# Patient Record
Sex: Female | Born: 1940 | Hispanic: No | Marital: Single
Health system: Midwestern US, Community
[De-identification: ages and names within clinical notes are randomized; demographics above are authoritative.]

---

## 2012-04-13 NOTE — Op Note (Unsigned)
[facility]  OPERATIVE REPORT                                            Name: Mena, Simonis                                            MR#: [id number]                                            Account #: [id number]                                            Date of Adm: 04/13/2012        Facility [facility]  DocId [id number]  ChartScript-[id number]    DATE OF SURGERY: 04/13/2012    SURGEON: Josetta Huddle, DPM    PREOPERATIVE DIAGNOSES  1. Soft tissue mass of the left foot sub-first metatarsal head.  2. Diabetes with peripheral neuropathy.    POSTOPERATIVE DIAGNOSES  1. Soft tissue mass of the left foot first metatarsal head.  2. Diabetes with peripheral neuropathy.    PROCEDURE PERFORMED: Excision of soft tissue mass  with O-to-Z flap closure, left plantar foot.    ANESTHESIOLOGIST:    DESCRIPTION OF PROCEDURE: The patient prepped and  draped in the usual sterile manner. Attention directed to the plantar  aspect of the left foot where a palpable nodular mass was noted  approximately 1.5 x 1.2 cm. This appeared to be raised at the skin  level at the junction of the first metatarsal head and great toe.  There was some interruption in skin lines, but no definitive lesion.  There seemed to be a hard nodular cyst in this area. Using a #15  scalpel, the mass itself was encircled in its entirety, leaving a  border of 3 to 4 mm of normal tissue. The dissection through the  epidermis, dermis, to the subcutaneous level and the skin section  itself was removed and undermined to deeper tissue at the  subcutaneous level at the subcutaneous level. A cystic structure  was noted, which was well encapsulated with small areas of blue  discoloration with small vascular structures attaching to a  contiguous ________through the subcutaneous level and deeper  fascia. This did penetrate down an additional 1 cm to the capsule  and under sesamoid area of the first metatarsal. This was dissected  with a  #15 scalpel and forceps and removed in its entirety,  submitted for pathology. Small little vessels that were bleeding  were tied with 3-0 Vicryl and the area flushed with saline solution.  In order to close the defect, an O-to-Z flap was created. The arms  of the flap were made both distally and proximally with curvilinear  incisions full-thickness to the fascial level. The skin was then  rotated centrally with full-thickness tissue to cover the large defect.  This encompassed an area of 2.5 x 2.5 cm. The "Z" itself  was closed after the flaps were rotated with 2 and 3-0 nylon simple  interrupted  sutures, thus completing a complete closure of the  defect. Bleeding was minimal at the procedure's end, and a  dressing consisting of Betadine-soaked Adaptic, sterile gauze,  fluffs, Kerlix bandage, cast padding, Ace bandage, and stockinette  was applied. The patient tolerated the procedure and the anesthesia  well. The patient placed in a surgical shoe. Instructions given for  nonweightbearing other than walking to the bathroom on the heel.  The patient prescribed Ultram 50 mg every 8 to 12 hours #20  dispensed. A followup visit in the office in 4 days. The patient  given the emergency numbers should a problem arise. The patient  discharged in good condition.        __________________________________  Enis Slipper / JS  D:  04/13/2012   13:59  T:  04/13/2012   15:32  Job #:  [id number]              wm    CS Doc#:  [id number]                                  Page 1 of 2

## 2018-08-17 ENCOUNTER — Ambulatory Visit: Attending: Neurology

## 2018-08-17 ENCOUNTER — Ambulatory Visit: Admit: 2018-08-17 | Discharge: 2018-08-17 | Payer: PRIVATE HEALTH INSURANCE | Attending: Neurology

## 2018-08-17 DIAGNOSIS — G629 Polyneuropathy, unspecified: Secondary | ICD-10-CM

## 2018-08-17 NOTE — Progress Notes (Signed)
Progress  Notes by Maxine Glenn, MD at 08/17/18 1115                Author: Maxine Glenn, MD  Service: --  Author Type: Physician       Filed: 08/17/18 1210  Encounter Date: 08/17/2018  Status: Signed          Editor: Maxine Glenn, MD (Physician)                   Maxine Glenn, MD    [address], [place] [address]  [phone]   [email or web address]      NEUROLOGY CONSULT NOTE           Name  Heather Brooks  Age  78 y.o.          MRN  [id number]  DOB  11-24-1940          DATE   08/17/2018               Referring Physician: No ref. provider found     Primary Physician: None      No chief complaint on file.            History of Present Illness:         The patient is a 77 y.o.  female  who has DM, HTN, Hypothyroidism, HLD, who is here for evaluation of:      Unsteadiness: ongoing for past year, more so on uneven grounds, has had occasional paresthesias in feet- on GBP 100mg  TID for a year now   No weakness.    Had a fall 2 years ago- with water on floor   Back pain- with sitting and long walks      Dizziness: Has had 2-3 episodes over the past 6 months, feels lightheaded when going from sitting to standing.      Had lab w/u recently 7/16 : Tsh, serum IFE, b12, homocysteine, hb   Kappa light chain noted- being followed by Dr. Hardin Negus   Also had a1c done- will get results faxed              Allergies not on file             No past medical history on file.       No past surgical history on file.         Social History          Tobacco Use         ?  Smoking status:  Not on file       Substance Use Topics         ?  Alcohol use:  Not on file            No family history on file.         Review of Systems:        Review of Systems    Musculoskeletal: Positive for back pain, joint pain  and myalgias. Falls: imbalance .    Neurological: Positive for dizziness and weakness .           Exam:           Visit Vitals      BP  112/68 (BP 1 Location: Left arm, BP Patient Position: Sitting)     Pulse  74     Temp  98  ??F (36.7 ??C) (Temporal)     Ht  5\' 1"  (1.549 m)  Wt  120 lb (54.4 kg)     SpO2  98%        BMI  22.67 kg/m??           General Exam:   General:     Well developed, well nourished. Patient in no apparent distress        Head:  Normocephalic, atraumatic, anicteric sclera              Ext:  No pedal edema        Skin:  No overt signs of rash or insect bites              Neurological Exam:      Mental Status:  Alert and oriented to person place and time        Speech:  Fluent no aphasia or dysarthria.     Cranial Nerves:     Facial sensation is normal. Facial movement is symmetric.  Palate is midline.  Normal sternocleidomastoid strength. Tongue is midline. Hearing is intact bilaterally.        Eyes:  EOM's full, no nystagmus, no ptosis.     Motor:   Full and symmetric strength of upper and lower proximal and distal muscles. Normal bulk and tone.      Reflexes:    Deep tendon reflexes 2+/4 and symmetrical, 1+ in ankles     Sensory:    Symmetrically intact  To proprioception, Rombergs negative     Gait:   Gait is balanced and fluid with normal arm swing.   Able to tandem walk.     Tremor:    No tremor noted.        Cerebellar:   Normal finger to nose bilaterally.                    Test Data:        Lab Data      No results found for this or any previous visit (from the past 1008 hour(s)).       Imaging Data      No results found.         Assessment/Plan:        Myranda has DM and has had unsteadiness for a year. Her clinical exam shows no obvious abnormality- although I do see that she is very cautious while walking. I would like to review the MRI Brain images (done at [facility]) and we will discuss that  next visit.   I would also suggest getting a NCS/EMG to evaluate her nerve function. She has had majority of the lab w/u that I typically do for neuropathy eval so we will not repeat them. I will reach out to Dr. Wilmon Paligheim's office top get the more recent labs.   I may have to check b6, mma and lymes pending review  of labs.            There is no problem list on file for this patient.           Orders Placed This Encounter        ?  METHYLMALONIC ACID     ?  LYME AB, IGG & IGM BY WB        ?  VITAMIN B6              There are no discontinued medications.     Greater than 50% of this 60 minute visit was spent counseling the patient regarding diagnosis, prognosis  and treatment and/or coordinating with outside facilities.                  This report was created using a voice recognition software program. Minor grammatical and syntax errors are common. Please call me if you have any concerns  or if any clarification is needed.       Maxine Glenn, MD      Note created electronically using EPIC, please excuse any typographic errors

## 2018-08-17 NOTE — Progress Notes (Signed)
Please notify patient that lab work up looks good

## 2018-08-17 NOTE — Progress Notes (Signed)
Era BumpersSweta Lashala Laser, MD   [address], [address]  [place], [place] [id number]  [phone]  [email or web address]    NEUROLOGY CONSULT NOTE    Name Heather Brooks Age 78 y.o.   MRN [id number] DOB 05/16/1940   DATE  08/17/2018         Referring Physician: No ref. provider found    Primary Physician: None    No chief complaint on file.       History of Present Illness:      The patient is a 78 y.o.  female who has DM, HTN, Hypothyroidism, HLD, who is here for evaluation of:    Unsteadiness: ongoing for past year, more so on uneven grounds, has had occasional paresthesias in feet- on GBP 100mg  TID for a year now  No weakness.   Had a fall 2 years ago- with water on floor  Back pain- with sitting and long walks    Dizziness: Has had 2-3 episodes over the past 6 months, feels lightheaded when going from sitting to standing.    Had lab w/u recently 7/16 : Tsh, serum IFE, b12, homocysteine, hb  Kappa light chain noted- being followed by Dr. Linna DarnerBenkel  Also had a1c done- will get results faxed          Allergies not on file         No past medical history on file.     No past surgical history on file.     Social History     Tobacco Use   ??? Smoking status: Not on file   Substance Use Topics   ??? Alcohol use: Not on file        No family history on file.     Review of Systems:     Review of Systems   Musculoskeletal: Positive for back pain, joint pain and myalgias. Falls: imbalance.   Neurological: Positive for dizziness and weakness.       Exam:       Visit Vitals  BP 112/68 (BP 1 Location: Left arm, BP Patient Position: Sitting)   Pulse 74   Temp 98 ??F (36.7 ??C) (Temporal)   Ht 5\' 1"  (1.549 m)   Wt 120 lb (54.4 kg)   SpO2 98%   BMI 22.67 kg/m??     General Exam:  General:   Well developed, well nourished. Patient in no apparent distress   Head: Normocephalic, atraumatic, anicteric sclera       Ext: No pedal edema   Skin: No overt signs of rash or insect bites         Neurological Exam:   Mental Status: Alert and oriented to person place and time   Speech: Fluent no aphasia or dysarthria.   Cranial Nerves:    Facial sensation is normal. Facial movement is symmetric.  Palate is midline.  Normal sternocleidomastoid strength. Tongue is midline. Hearing is intact bilaterally.   Eyes: EOM's full, no nystagmus, no ptosis.   Motor:  Full and symmetric strength of upper and lower proximal and distal muscles. Normal bulk and tone.    Reflexes:   Deep tendon reflexes 2+/4 and symmetrical, 1+ in ankles   Sensory:   Symmetrically intact  To proprioception, Rombergs negative   Gait:  Gait is balanced and fluid with normal arm swing.   Able to tandem walk.   Tremor:   No tremor noted.   Cerebellar:  Normal finger to nose bilaterally.         Test Data:  Lab Data    No results found for this or any previous visit (from the past 1008 hour(s)).     Imaging Data    No results found.     Assessment/Plan:     Heather Brooks has DM and has had unsteadiness for a year. Her clinical exam shows no obvious abnormality- although I do see that she is very cautious while walking. I would like to review the MRI Brain images (done at [facility]) and we will discuss that next visit.  I would also suggest getting a NCS/EMG to evaluate her nerve function. She has had majority of the lab w/u that I typically do for neuropathy eval so we will not repeat them. I will reach out to Dr. Marene Lenz office top get the more recent labs.  I may have to check b6, mma and lymes pending review of labs.        There is no problem list on file for this patient.      Orders Placed This Encounter   ??? METHYLMALONIC ACID   ??? LYME AB, IGG & IGM BY WB   ??? VITAMIN B6         There are no discontinued medications.    Greater than 50% of this 60 minute visit was spent counseling the patient regarding diagnosis, prognosis and treatment and/or coordinating with outside facilities.              This report was created using a voice recognition software program. Minor grammatical and syntax errors are common. Please call me if you have any concerns or if any clarification is needed.     Maxine Glenn, MD    Note created electronically using EPIC, please excuse any typographic errors

## 2018-09-01 LAB — LYME AB, IGG & IGM BY WB
IGG P18 AB: ABSENT
IGG P23 AB: ABSENT
IGG P28 AB: ABSENT
IGG P30 AB: ABSENT
IGG P45 AB: ABSENT
IGG P58 AB: ABSENT
IGG P66 AB: ABSENT
IGM P39 AB: ABSENT
IGM P41 AB: ABSENT
IgG P18 Ab: ABSENT
IgG P23 Ab: ABSENT
IgG P28 Ab: ABSENT
IgG P30 Ab: ABSENT
IgG P45 Ab: ABSENT
IgG P58 Ab: ABSENT
IgG P66 Ab: ABSENT
IgG P93 Ab: ABSENT
IgG P93 Ab: ABSENT
IgM P39 Ab: ABSENT
IgM P41 Ab: ABSENT
LYME AB, IGG WB INTERP.: NEGATIVE
LYME AB, IGM WB INTERP.: NEGATIVE
Lyme Ab, IgG WB Interp.: NEGATIVE
Lyme Ab, IgM WB Interp.: NEGATIVE

## 2018-09-01 LAB — VITAMIN B6
VITAMIN B6, 004656: 33.2 ug/L — ABNORMAL HIGH (ref 2.0–32.8)
Vitamin B6: 33.2 ug/L — ABNORMAL HIGH (ref 2.0–32.8)

## 2018-09-01 LAB — METHYLMALONIC ACID: METHYLMALONIC ACID, SERUM: 193 nmol/L (ref 0–378)

## 2018-09-01 LAB — ONCOTYPE DX DCIS: METHYLMALONIC ACID, SERUM: 193 nmol/L (ref 0–378)

## 2018-09-05 ENCOUNTER — Ambulatory Visit: Attending: Neurology

## 2018-09-05 ENCOUNTER — Ambulatory Visit: Admit: 2018-09-05 | Discharge: 2018-09-05 | Payer: PRIVATE HEALTH INSURANCE | Attending: Neurology

## 2018-09-05 DIAGNOSIS — G629 Polyneuropathy, unspecified: Secondary | ICD-10-CM

## 2018-09-05 MED ORDER — CYANOCOBALAMIN 500 MCG TAB
500 mcg | ORAL_TABLET | Freq: Every day | ORAL | 2 refills | Status: AC
Start: 2018-09-05 — End: ?

## 2018-09-05 NOTE — Progress Notes (Signed)
Progress  Notes by Heather Glenn, MD at 09/05/18 1230                Author: Maxine Glenn, MD  Service: --  Author Type: Physician       Filed: 09/05/18 1412  Encounter Date: 09/05/2018  Status: Signed          Editor: Heather Glenn, MD (Physician)                   Heather Glenn, MD    [address], [place] [address]  [phone]   [email or web address]      NEUROLOGY CONSULT NOTE           Name  Heather Brooks  Age  78 y.o.          MRN  [id number]  DOB  Sep 29, 1940          DATE   09/05/2018               Referring Physician: Maxine Glenn, MD     Primary Physician: None      No chief complaint on file.       09/05/2018: Since last visit had lab work-up for Lyme, B6 and methylmalonic acid that were all essentially normal.   TSH normal, A1c 5.9-scanned in chart   MRI brain report reviewed- no report of cerebellar atrophy, appears age appropriate.        History of Present Illness:         The patient is a 78 y.o.  female  who has DM, HTN, Hypothyroidism, HLD, who is here for evaluation of:      Unsteadiness: ongoing for past year, more so on uneven grounds, has had occasional paresthesias in feet- on GBP 100mg  TID for a year now   No weakness.    Had a fall 2 years ago- with water on floor   Back pain- with sitting and long walks      Dizziness: Has had 2-3 episodes over the past 6 months, feels lightheaded when going from sitting to standing.      Had lab w/u recently 7/16 : Tsh, serum IFE, b12, homocysteine, hb   Kappa light chain noted- being followed by Dr. Hardin Brooks   Also had a1c done- will get results faxed              Allergies not on file               Past Medical History:        Diagnosis  Date         ?  Diabetes mellitus ([place])       ?  Hypercholesteremia       ?  Osteoarthritis           ?  Thyroid disorder                Past Surgical History:         Procedure  Laterality  Date          ?  HX CATARACT REMOVAL         ?  HX KNEE ARTHROSCOPY         ?  HX MASTECTOMY         ?  HX ROTATOR CUFF  REPAIR  N/A            Shoulder  Social History          Tobacco Use         ?  Smoking status:  Former Smoker              Packs/day:  2.00         Years:  25.00         Pack years:  50.00         ?  Smokeless tobacco:  Never Used       Substance Use Topics         ?  Alcohol use:  Yes            No family history on file.         Review of Systems:        Review of Systems    Musculoskeletal: Positive for back pain, joint pain  and myalgias. Falls: imbalance .    Neurological: Positive for dizziness and weakness .           Exam:           Visit Vitals      Temp  97.9 ??F (36.6 ??C) (Temporal)     Ht  5\' 1"  (1.549 m)     Wt  118 lb (53.5 kg)        BMI  22.30 kg/m??           General Exam:   General:     Well developed, well nourished. Patient in no apparent distress        Head:  Normocephalic, atraumatic, anicteric sclera              Ext:  No pedal edema        Skin:  No overt signs of rash or insect bites              Neurological Exam:      Mental Status:  Alert and oriented to person place and time        Speech:  Fluent no aphasia or dysarthria.     Cranial Nerves:     Facial sensation is normal. Facial movement is symmetric.  Palate is midline.  Normal sternocleidomastoid strength. Tongue is midline. Hearing is intact bilaterally.        Eyes:  EOM's full, no nystagmus, no ptosis.     Motor:   Full and symmetric strength of upper and lower proximal and distal muscles. Normal bulk and tone.      Reflexes:    Deep tendon reflexes 2+/4 and symmetrical, 1+ in ankles     Sensory:    Symmetrically intact  To proprioception, Rombergs negative     Gait:   Gait is balanced and fluid with normal arm swing.   Able to tandem walk.     Tremor:    No tremor noted.        Cerebellar:   Normal finger to nose bilaterally.                    Test Data:        Lab Data        Recent Results (from the past 1008 hour(s))     METHYLMALONIC ACID          Collection Time: 08/29/18  1:33 PM         Result  Value  Ref  Range            METHYLMALONIC ACID,  SERUM  193  0 - 378 nmol/L       Disclaimer  Comment         LYME AB, IGG & IGM BY WB          Collection Time: 08/29/18  1:33 PM         Result  Value  Ref Range            IgG P93 Ab  Absent         IgG P66 Ab  Absent         IgG P58 Ab  Absent         IgG P45 Ab  Absent         IgG P41 Ab  Present (A)         IgG P39 Ab  Present (A)         IgG P30 Ab  Absent         IgG P28 Ab  Absent         IgG P23 Ab  Absent         IgG P18 Ab  Absent         Lyme Ab, IgG WB Interp.  Negative         IgM P41 Ab  Absent         IgM P39 Ab  Absent         IgM P23 Ab  Present (A)         Lyme Ab, IgM WB Interp.  Negative         VITAMIN B6          Collection Time: 08/29/18  1:33 PM         Result  Value  Ref Range            Vitamin B6  33.2 (H)  2.0 - 32.8 ug/L            Imaging Data      No results found.         Assessment/Plan:        Heather Brooks has DM and has had unsteadiness for a year. Her clinical exam shows no obvious abnormality- although I do see that she is very cautious while walking.    Reviewed MRI brain report.     Nerve conduction study/EMG today shows no evidence of a diffuse large fiber neuropathy.     Lab work-up has essentially been normal.     Given that she is on metformin, I have advised her to start vitamin B12 supplementation 500 mcg daily as chronic metformin use has been associated with B12 deficiency (blood levels can be normal)   I will see her back in 3 months.                 There is no problem list on file for this patient.         No orders of the defined types were placed in this encounter.            There are no discontinued medications.     Greater than 50% of this 25 minute visit was spent counseling the patient regarding diagnosis, prognosis and treatment and/or coordinating with outside facilities.                  This report was created using a voice recognition software program. Minor grammatical and syntax errors are common. Please call me if you  have any concerns  or if any clarification is needed.       Heather Bumpers, MD      Note created electronically using EPIC, please excuse any typographic errors

## 2018-09-05 NOTE — Progress Notes (Signed)
Heather Glenn, MD   [address], [place] [address]  [phone]  [email or web address]    NEUROLOGY CONSULT NOTE    Name Heather Brooks Age 78 y.o.   MRN [id number] DOB Apr 12, 1940   DATE  09/05/2018         Referring Physician: Maxine Glenn, MD    Primary Physician: None    No chief complaint on file.     09/05/2018: Since last visit had lab work-up for Lyme, B6 and methylmalonic acid that were all essentially normal.  TSH normal, A1c 5.9-scanned in chart  MRI brain report reviewed??? no report of cerebellar atrophy, appears age appropriate.    History of Present Illness:      The patient is a 78 y.o.  female who has DM, HTN, Hypothyroidism, HLD, who is here for evaluation of:    Unsteadiness: ongoing for past year, more so on uneven grounds, has had occasional paresthesias in feet- on GBP 100mg  TID for a year now  No weakness.   Had a fall 2 years ago- with water on floor  Back pain- with sitting and long walks    Dizziness: Has had 2-3 episodes over the past 6 months, feels lightheaded when going from sitting to standing.    Had lab w/u recently 7/16 : Tsh, serum IFE, b12, homocysteine, hb  Kappa light chain noted- being followed by Dr. Hardin Brooks  Also had a1c done- will get results faxed          Allergies not on file         Past Medical History:   Diagnosis Date   ??? Diabetes mellitus ([place])    ??? Hypercholesteremia    ??? Osteoarthritis    ??? Thyroid disorder         Past Surgical History:   Procedure Laterality Date   ??? HX CATARACT REMOVAL     ??? HX KNEE ARTHROSCOPY     ??? HX MASTECTOMY     ??? HX ROTATOR CUFF REPAIR N/A     Shoulder         Social History     Tobacco Use   ??? Smoking status: Former Smoker     Packs/day: 2.00     Years: 25.00     Pack years: 50.00   ??? Smokeless tobacco: Never Used   Substance Use Topics   ??? Alcohol use: Yes        No family history on file.     Review of Systems:     Review of Systems   Musculoskeletal: Positive for back pain, joint pain and myalgias. Falls: imbalance.    Neurological: Positive for dizziness and weakness.       Exam:       Visit Vitals  Temp 97.9 ??F (36.6 ??C) (Temporal)   Ht 5\' 1"  (1.549 m)   Wt 118 lb (53.5 kg)   BMI 22.30 kg/m??     General Exam:  General:   Well developed, well nourished. Patient in no apparent distress   Head: Normocephalic, atraumatic, anicteric sclera       Ext: No pedal edema   Skin: No overt signs of rash or insect bites         Neurological Exam:  Mental Status: Alert and oriented to person place and time   Speech: Fluent no aphasia or dysarthria.   Cranial Nerves:    Facial sensation is normal. Facial movement is symmetric.  Palate is midline.  Normal sternocleidomastoid strength.  Tongue is midline. Hearing is intact bilaterally.   Eyes: EOM's full, no nystagmus, no ptosis.   Motor:  Full and symmetric strength of upper and lower proximal and distal muscles. Normal bulk and tone.    Reflexes:   Deep tendon reflexes 2+/4 and symmetrical, 1+ in ankles   Sensory:   Symmetrically intact  To proprioception, Rombergs negative   Gait:  Gait is balanced and fluid with normal arm swing.   Able to tandem walk.   Tremor:   No tremor noted.   Cerebellar:  Normal finger to nose bilaterally.         Test Data:     Lab Data    Recent Results (from the past 1008 hour(s))   METHYLMALONIC ACID    Collection Time: 08/29/18  1:33 PM   Result Value Ref Range    METHYLMALONIC ACID, SERUM 193 0 - 378 nmol/L    Disclaimer Comment    LYME AB, IGG & IGM BY WB    Collection Time: 08/29/18  1:33 PM   Result Value Ref Range    IgG P93 Ab Absent     IgG P66 Ab Absent     IgG P58 Ab Absent     IgG P45 Ab Absent     IgG P41 Ab Present (A)     IgG P39 Ab Present (A)     IgG P30 Ab Absent     IgG P28 Ab Absent     IgG P23 Ab Absent     IgG P18 Ab Absent     Lyme Ab, IgG WB Interp. Negative     IgM P41 Ab Absent     IgM P39 Ab Absent     IgM P23 Ab Present (A)     Lyme Ab, IgM WB Interp. Negative    VITAMIN B6    Collection Time: 08/29/18  1:33 PM   Result Value Ref Range     Vitamin B6 33.2 (H) 2.0 - 32.8 ug/L        Imaging Data    No results found.     Assessment/Plan:     Heather Brooks has DM and has had unsteadiness for a year. Her clinical exam shows no obvious abnormality- although I do see that she is very cautious while walking.   Reviewed MRI brain report.    Nerve conduction study/EMG today shows no evidence of a diffuse large fiber neuropathy.    Lab work-up has essentially been normal.    Given that she is on metformin, I have advised her to start vitamin B12 supplementation 500 mcg daily as chronic metformin use has been associated with B12 deficiency (blood levels can be normal)  I will see her back in 3 months.            There is no problem list on file for this patient.      No orders of the defined types were placed in this encounter.        There are no discontinued medications.    Greater than 50% of this 25 minute visit was spent counseling the patient regarding diagnosis, prognosis and treatment and/or coordinating with outside facilities.             This report was created using a voice recognition software program. Minor grammatical and syntax errors are common. Please call me if you have any concerns or if any clarification is needed.     Heather BumpersSweta Heather Pinzon, MD    Note  created electronically using EPIC, please excuse any typographic errors

## 2018-11-01 ENCOUNTER — Encounter: Attending: Neurology

## 2020-01-25 IMAGING — MG MAMMOGRAM SCREENING LEFT UNILATERAL 3D TOMO WITH CAD
2 series · 3 of 6 positions shown · non-contrast
Comparison: 11/06/2018.

MAMMOGRAM SCREENING LEFT UNILATERAL 3D TOMO WITH CAD, 01/25/2020 [DATE]: 
CLINICAL INDICATION: History of right breast cancer with mastectomy 8706.
TECHNIQUE: Unilateral left breast digital mammography and 3-D Tomosynthesis were 
obtained. In addition, computer-aided detection was utilized. 
BREAST DENSITY: (Level C) The breasts are heterogeneously dense, which may 
obscure small masses.

[L MLO]
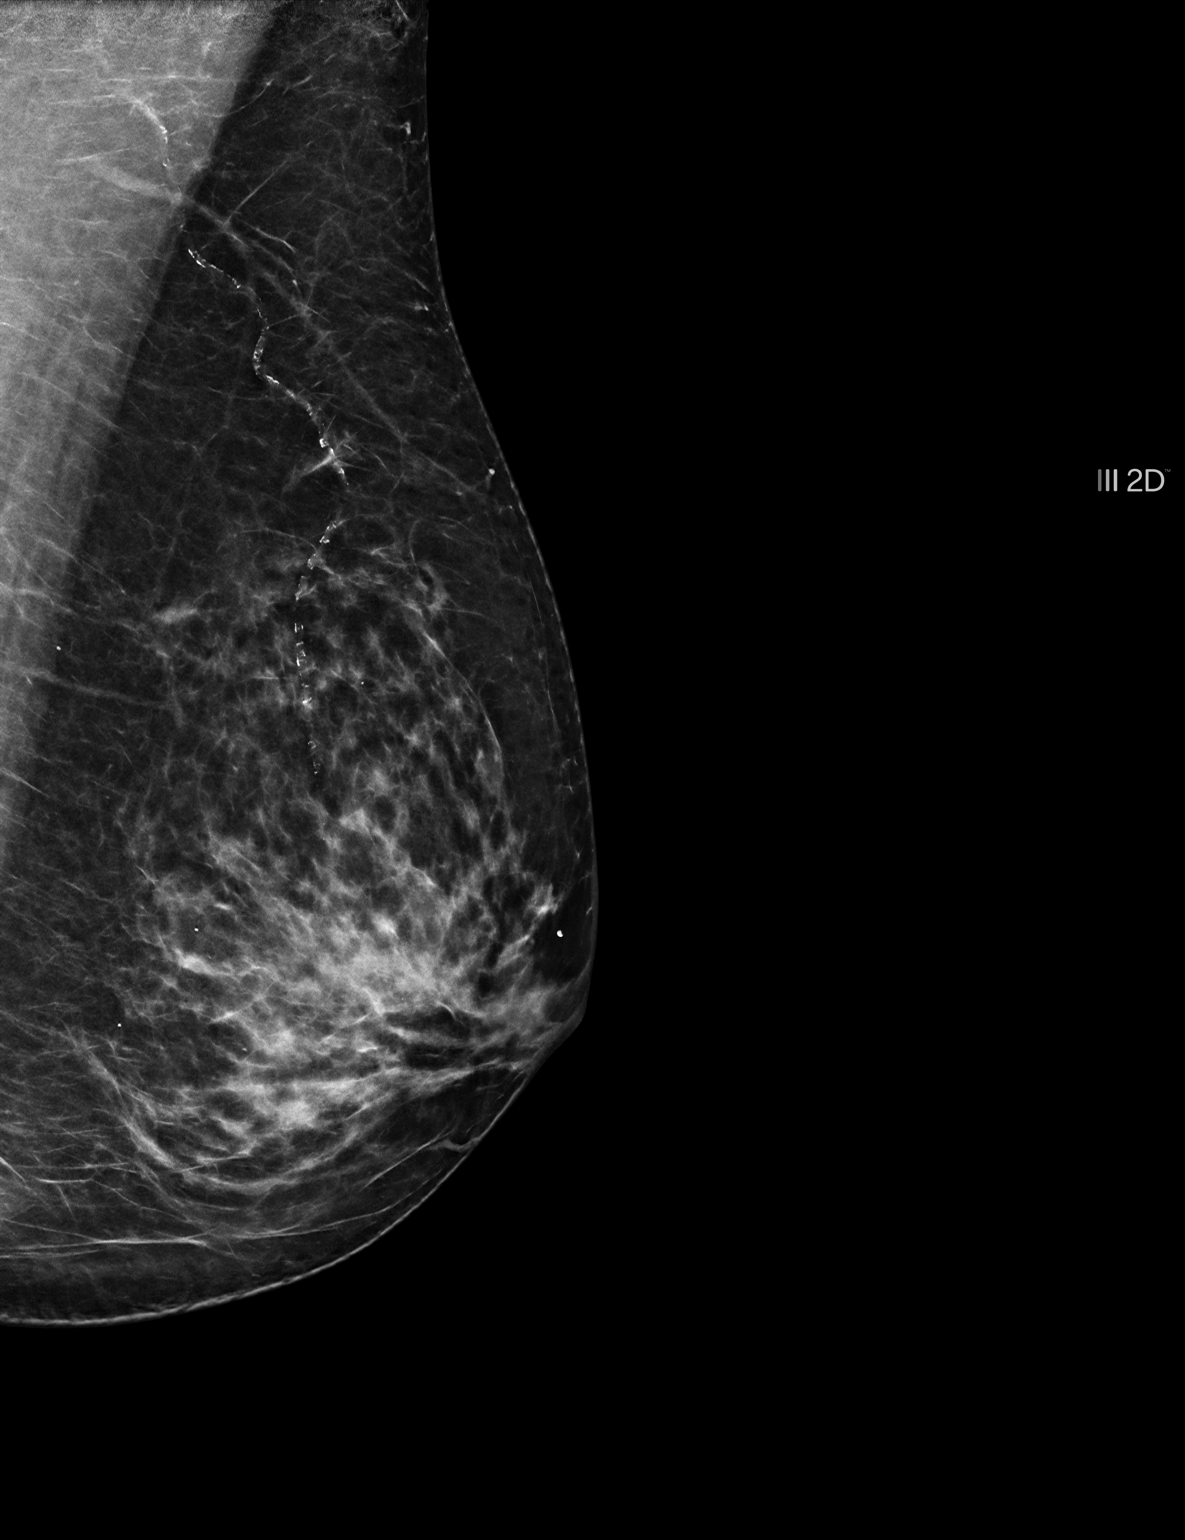

[L MLO tomo · 2 of 62 frames shown]
[frame 21/62]
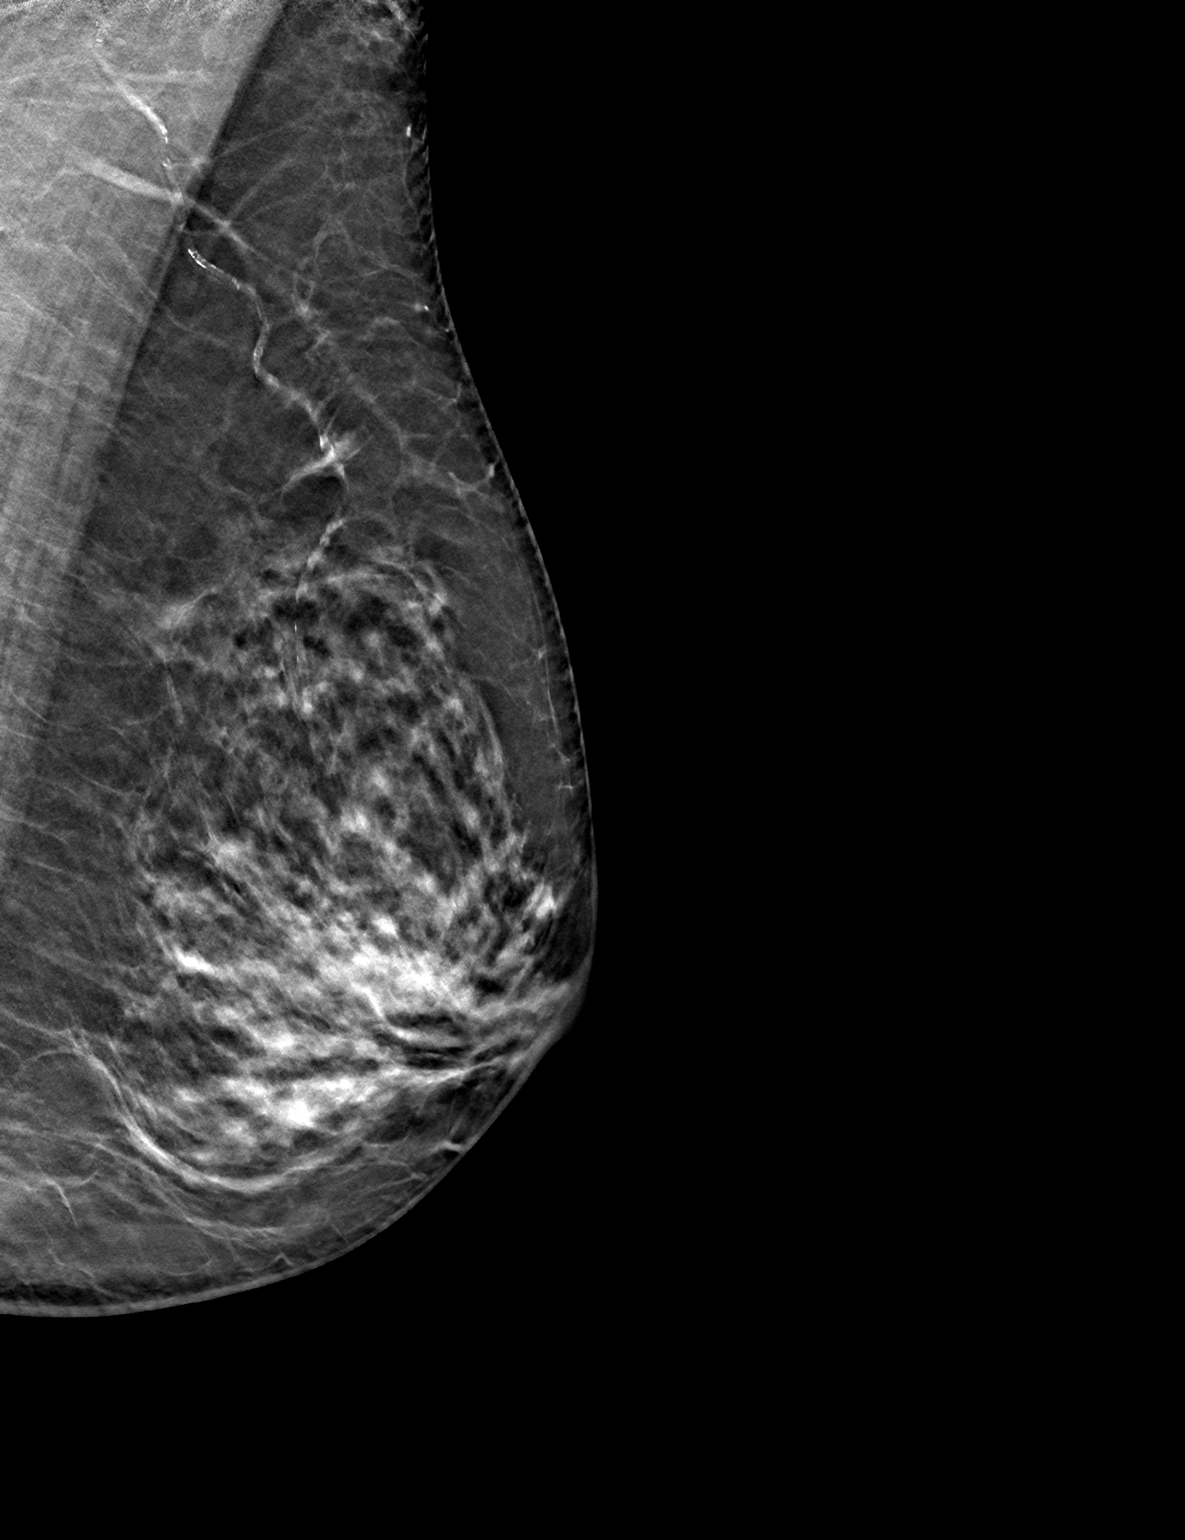
[frame 31/62]
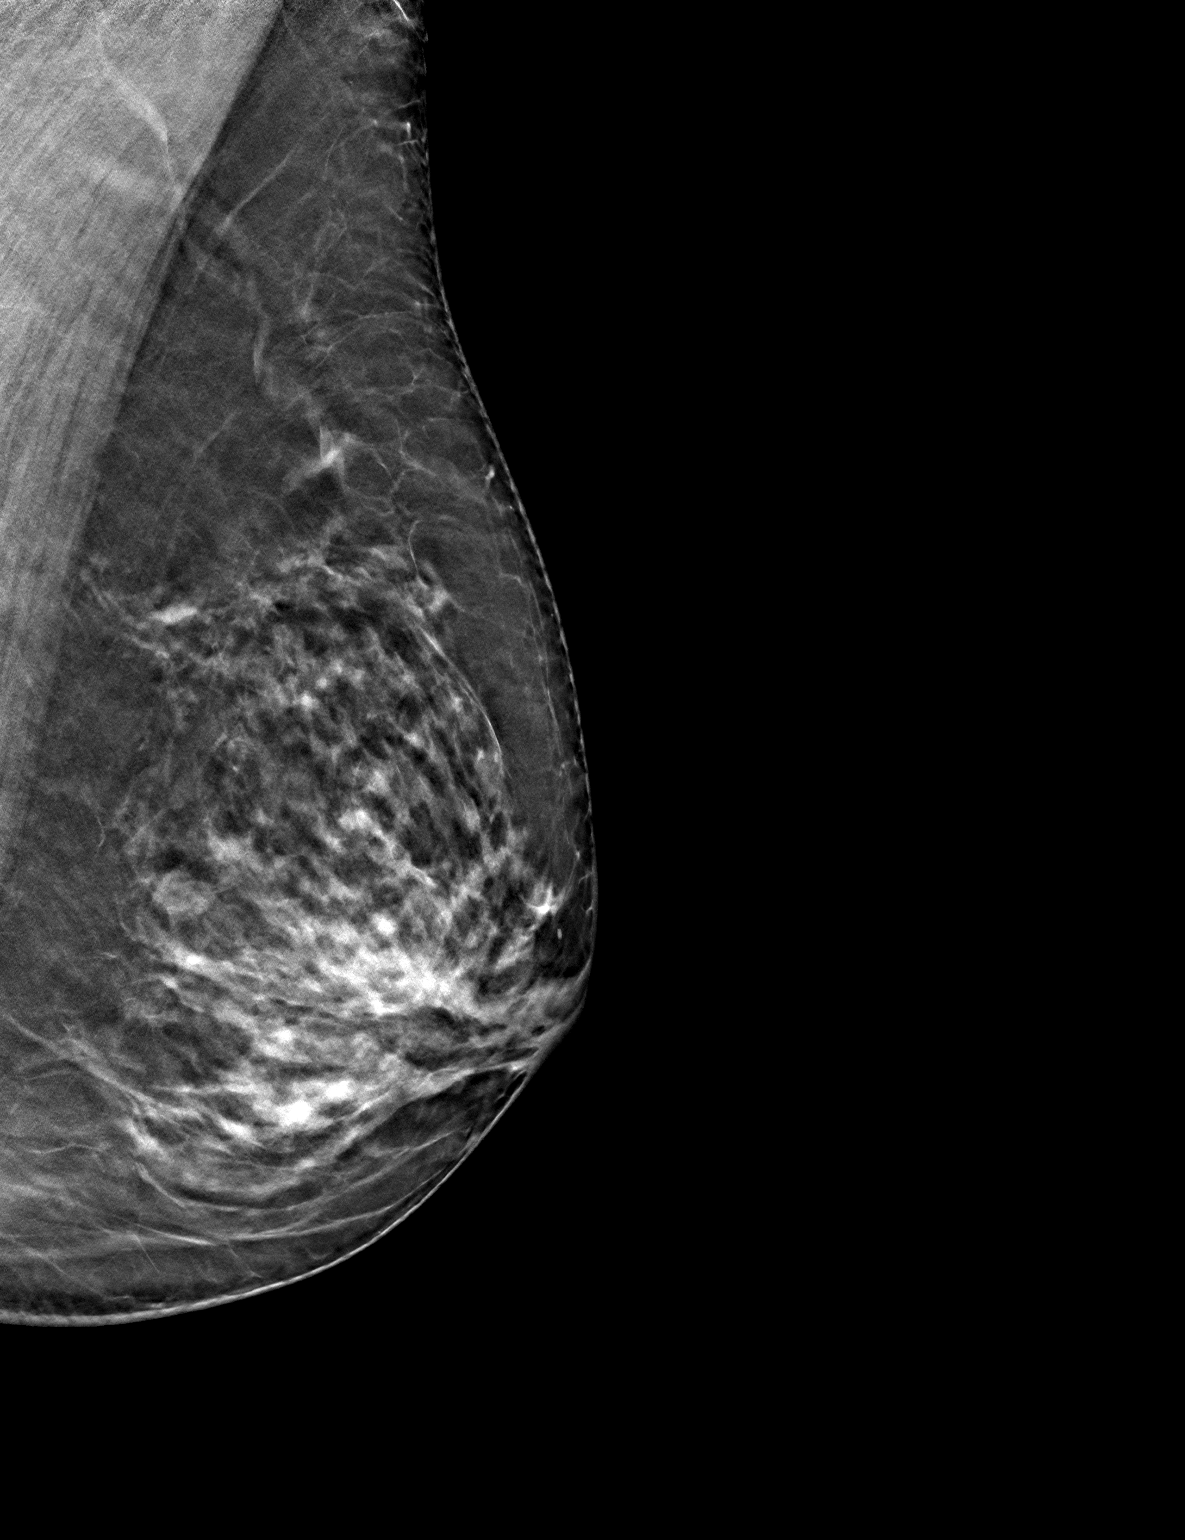

[3 of 6 positions shown; findings below may reference images not displayed]

FINDINGS: No suspicious mass, calcifications, or area of architectural 
distortion in the left breast.
IMPRESSION: No mammographic evidence of malignancy in the  left breast. 
( BI-RADS 2) Benign findings. Routine mammographic follow-up is recommended.

## 2021-01-27 IMAGING — MG MAMMOGRAPHY SCREENING LEFT UNILATERAL 3D TO
4 series · 4 of 12 positions shown · non-contrast
Comparison: Comparison was made to prior examinations.

________________________________________________________________________________________________ 
MAMMOGRAPHY SCREENING LEFT UNILATERAL 3D TO, 01/27/2021 [DATE]: 
CLINICAL INDICATION: Screening left mammogram. Previous right mastectomy for 
breast cancer in 6061
TECHNIQUE: Unilateral left breast digital mammography and 3-D Tomosynthesis were 
obtained. In addition, computer-aided detection was utilized. 
BREAST DENSITY: (Level C) The breasts are heterogeneously dense, which may 
obscure small masses.

[L MLO]
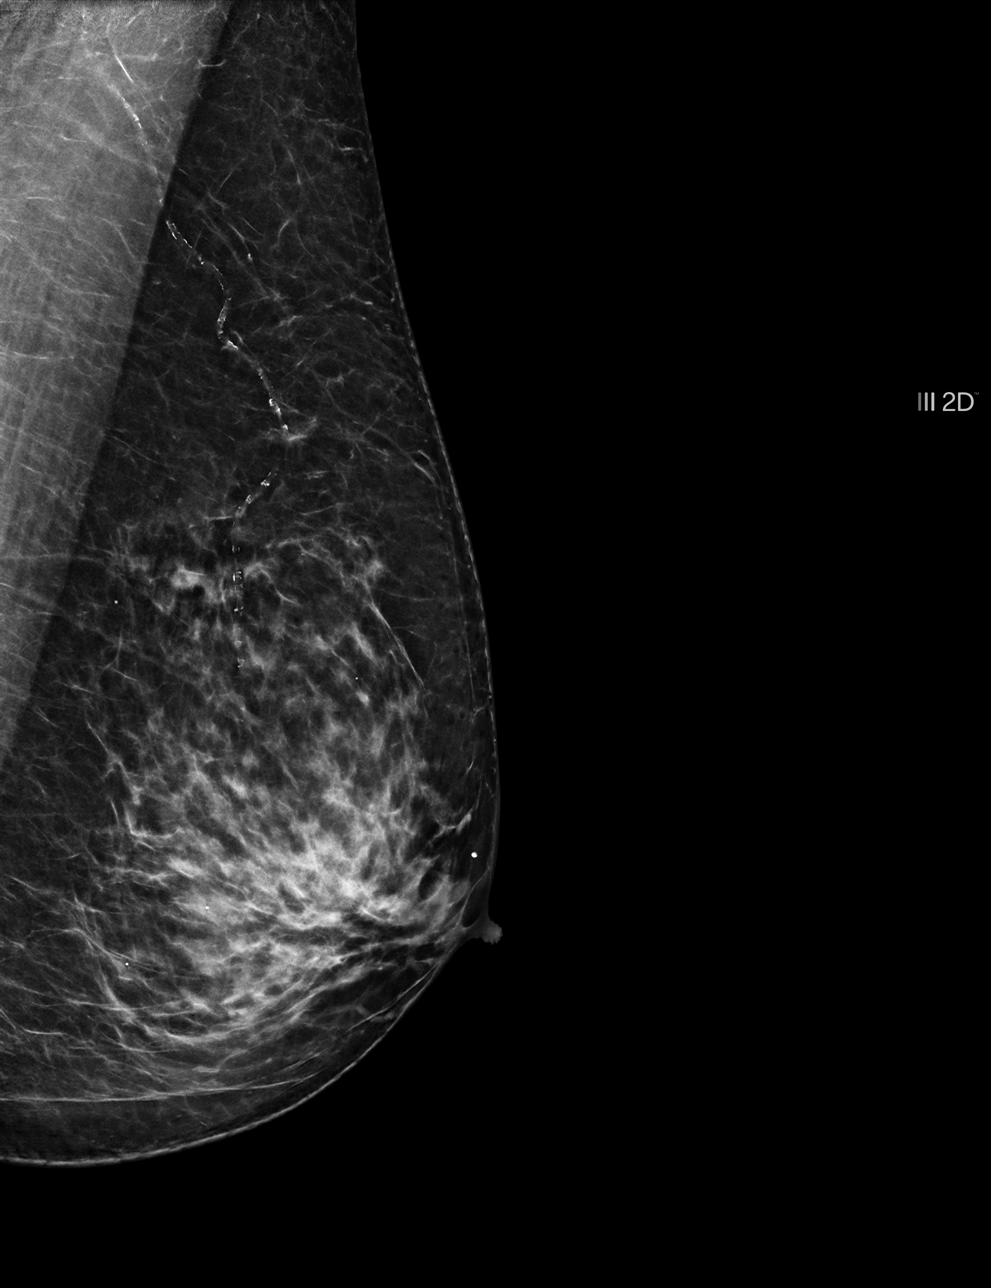

[L CC]
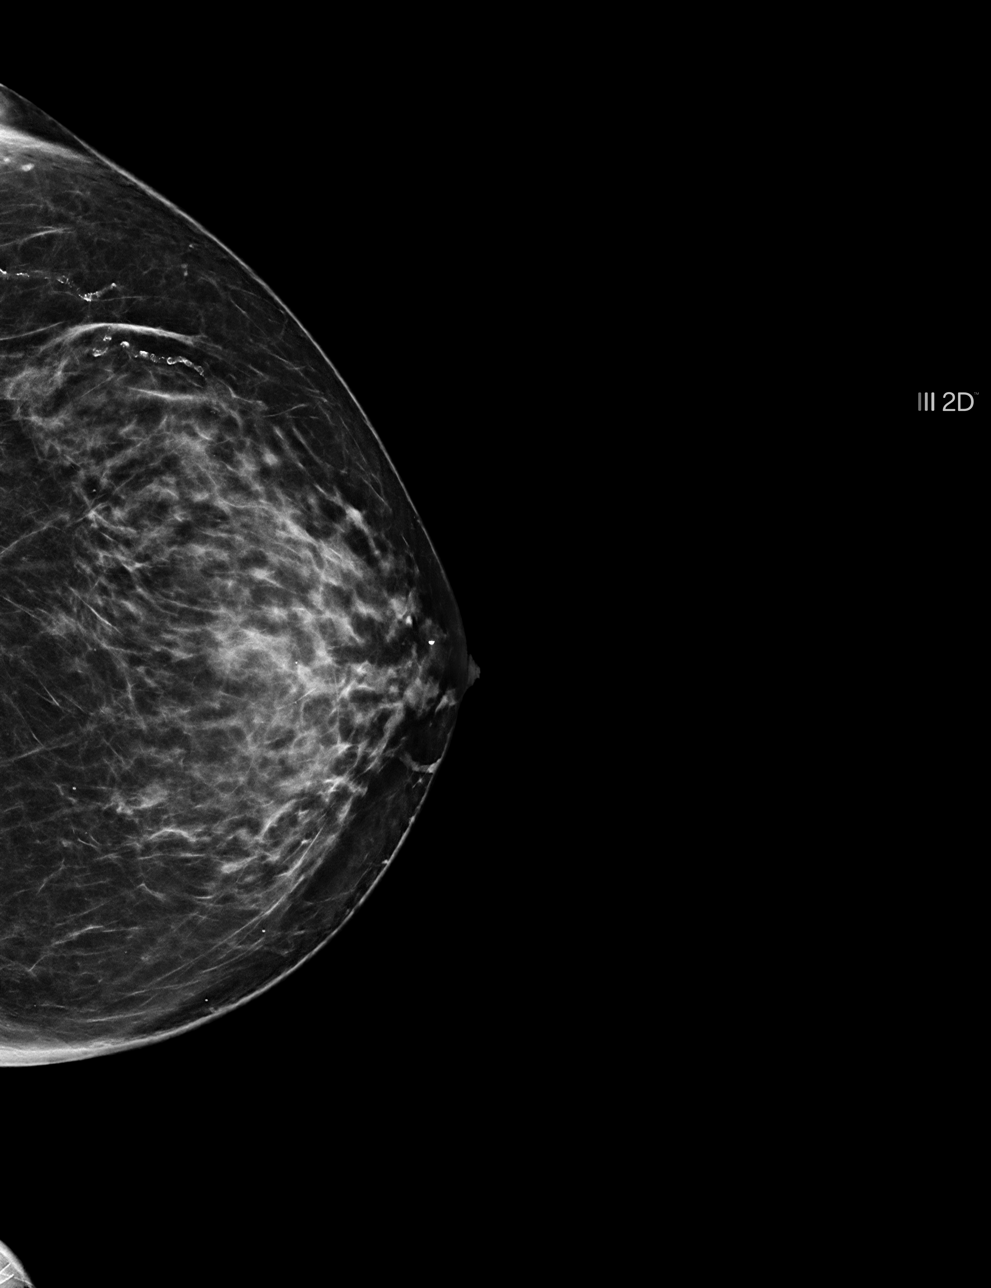

[L MLO tomo · tomo slice 31/60.0]
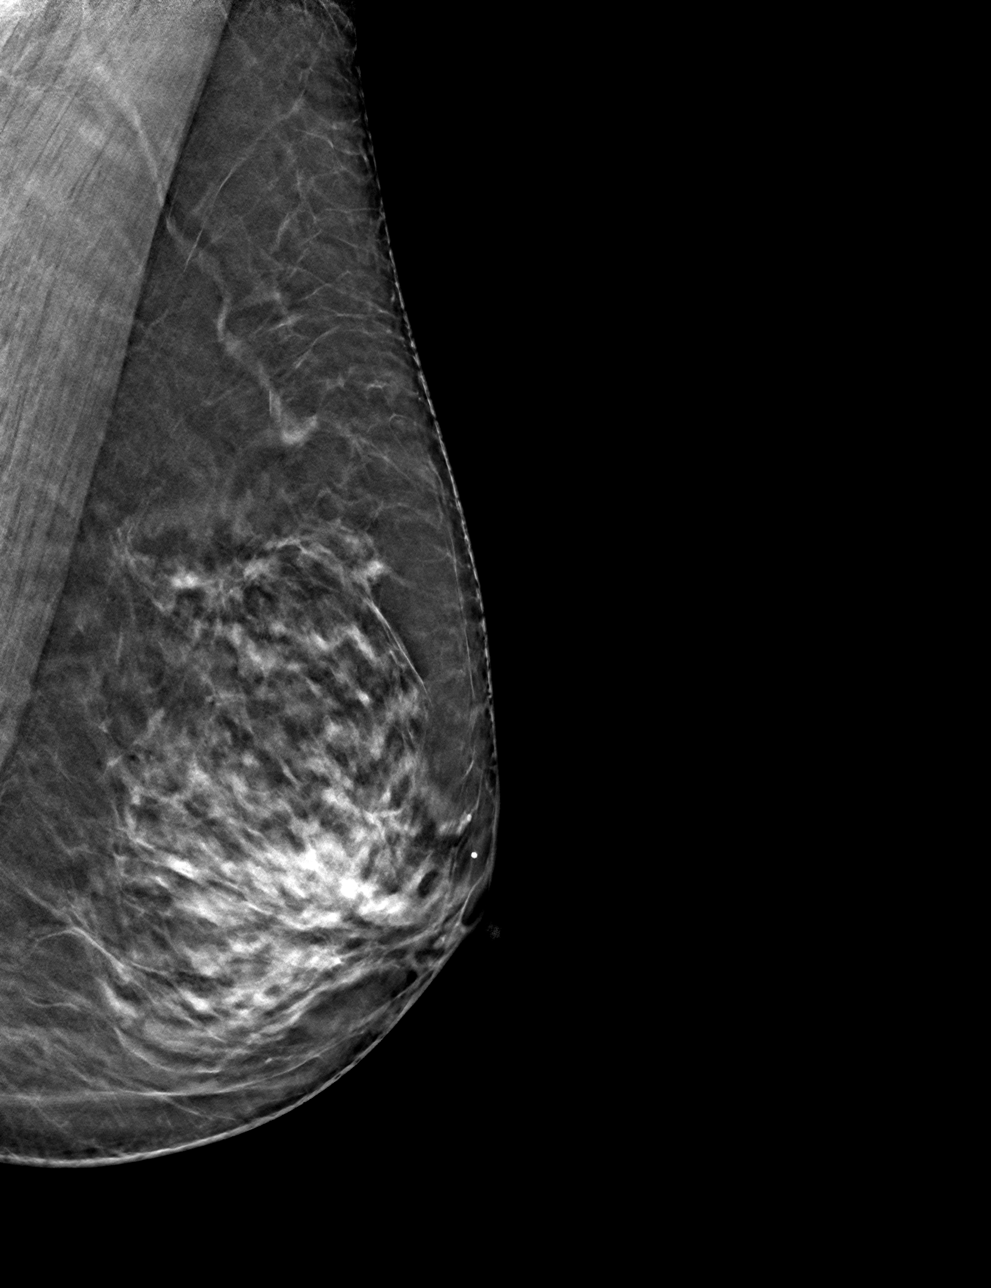

[L CC tomo · tomo slice 33/64.0]
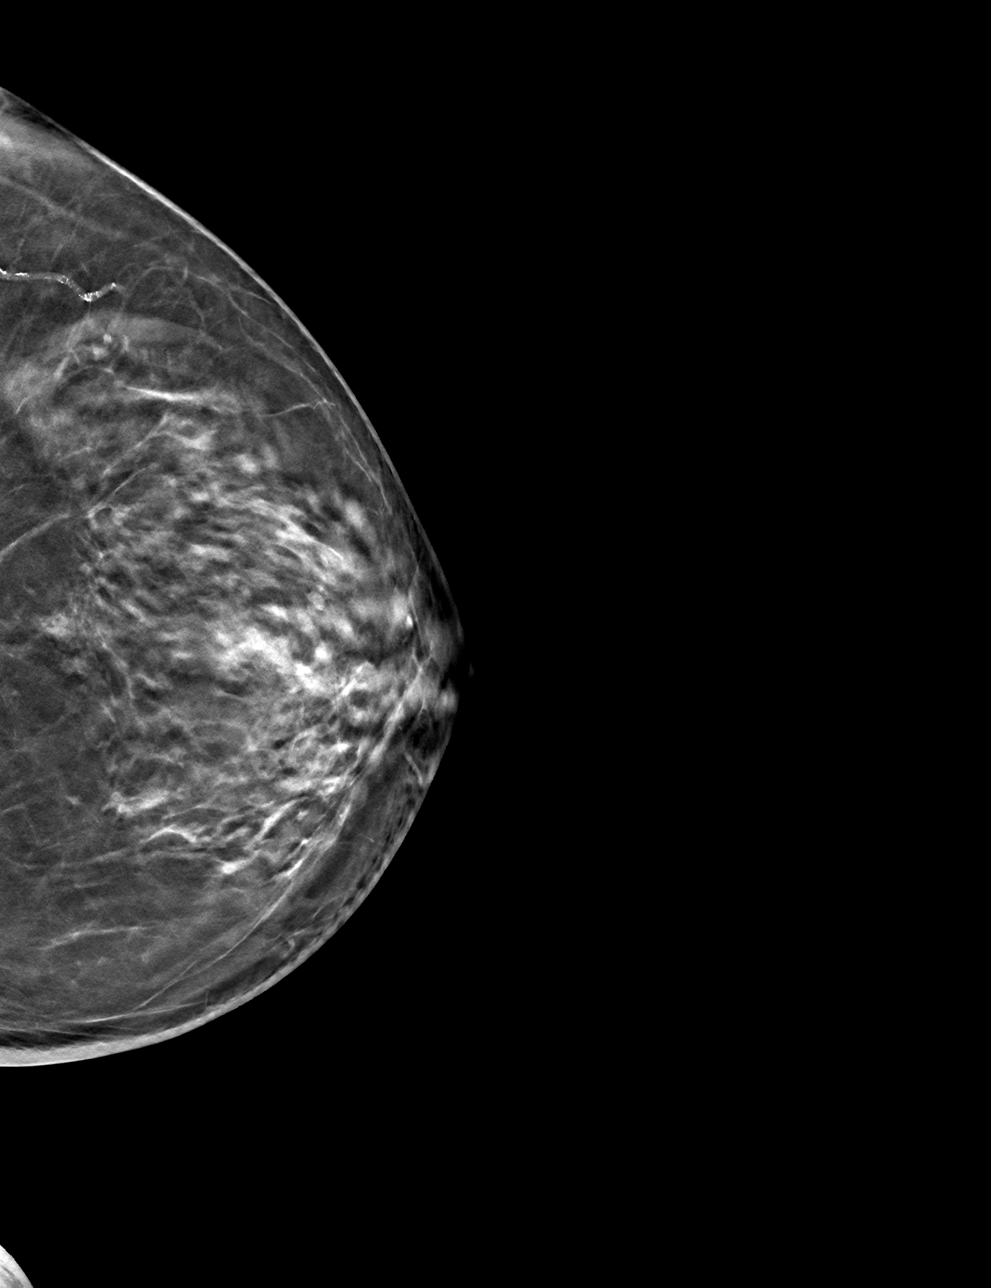

[4 of 12 positions shown; findings below may reference images not displayed]

FINDINGS: No suspicious mass, calcifications, or area of architectural 
distortion in the left breast.
IMPRESSION: (BI-RADS 2) Benign findings. Routine mammographic follow-up is recommended.

## 2022-03-10 IMAGING — MG MAMMOGRAPHY SCREENING LEFT UNILATERAL 3D TOMO
4 series · 4 of 12 positions shown · non-contrast
Comparison: Comparison was made to prior examinations.

________________________________________________________________________________________________ 
MAMMOGRAPHY SCREENING LEFT UNILATERAL 3D TOMO, 03/10/2022 [DATE]: 
CLINICAL INDICATION: History of right mastectomy 0981
TECHNIQUE: Unilateral left breast digital mammography and 3-D Tomosynthesis were 
obtained. In addition, computer-aided detection was utilized. 
BREAST DENSITY: (Level D) The breasts are extremely dense, which lowers the 
sensitivity of mammography.

[L MLO]
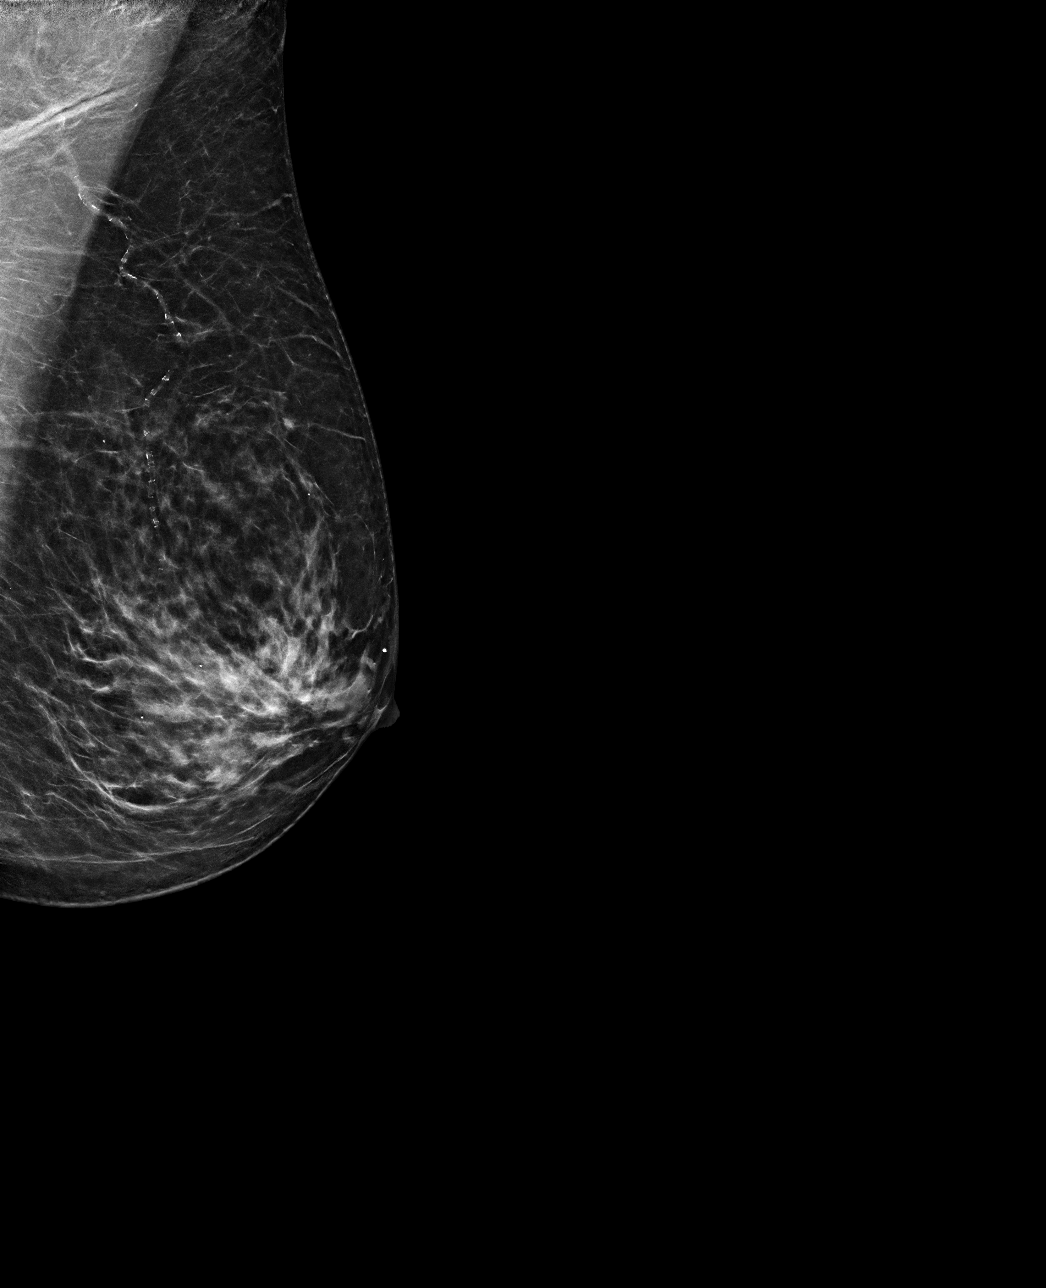

[L CC]
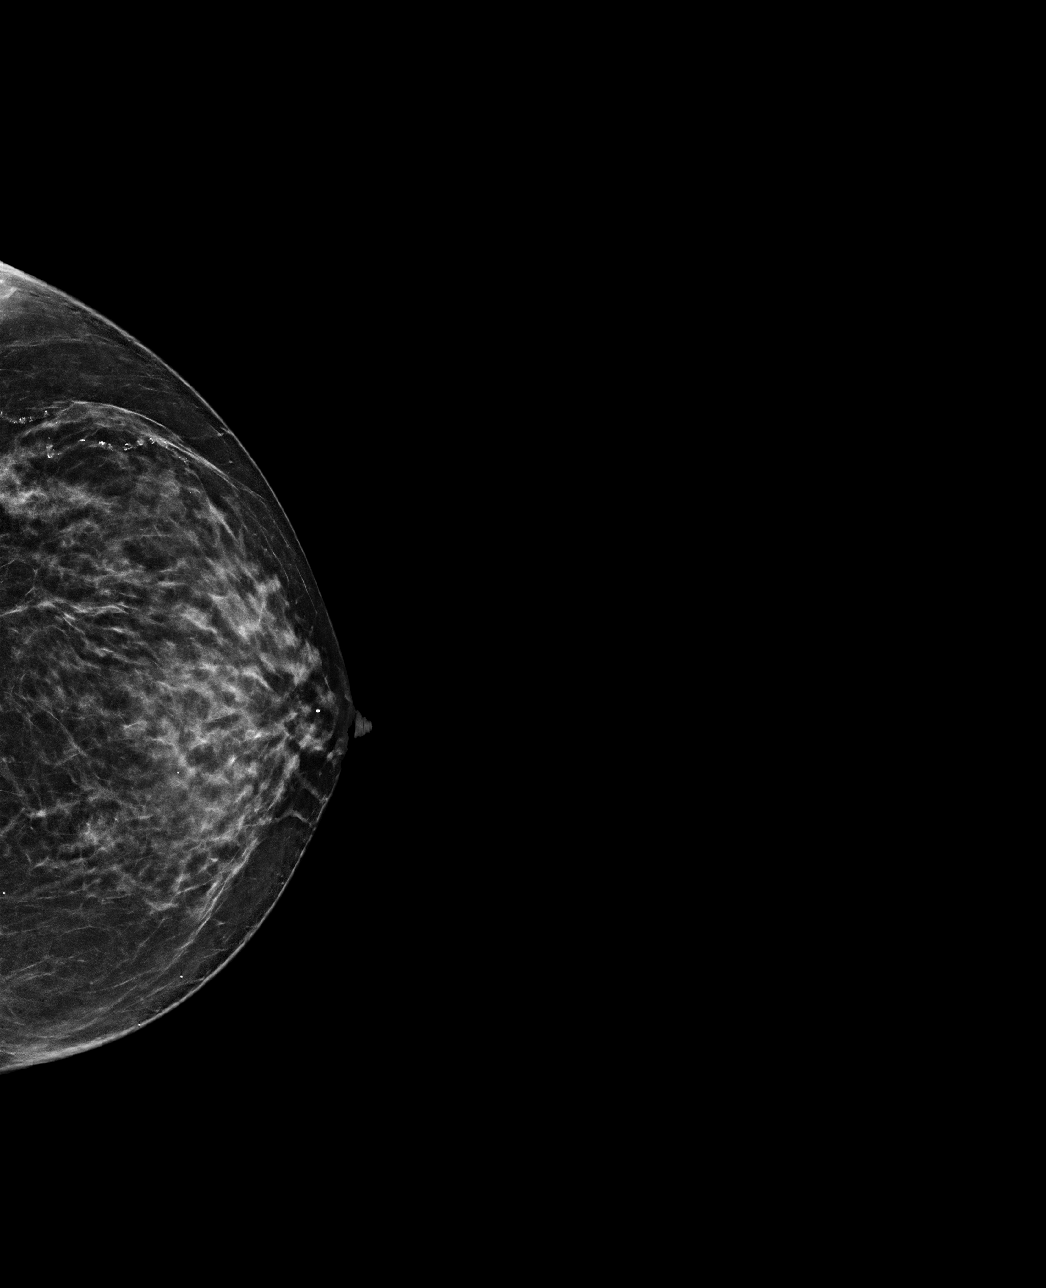

[L CC tomo · tomo slice 31/62.0]
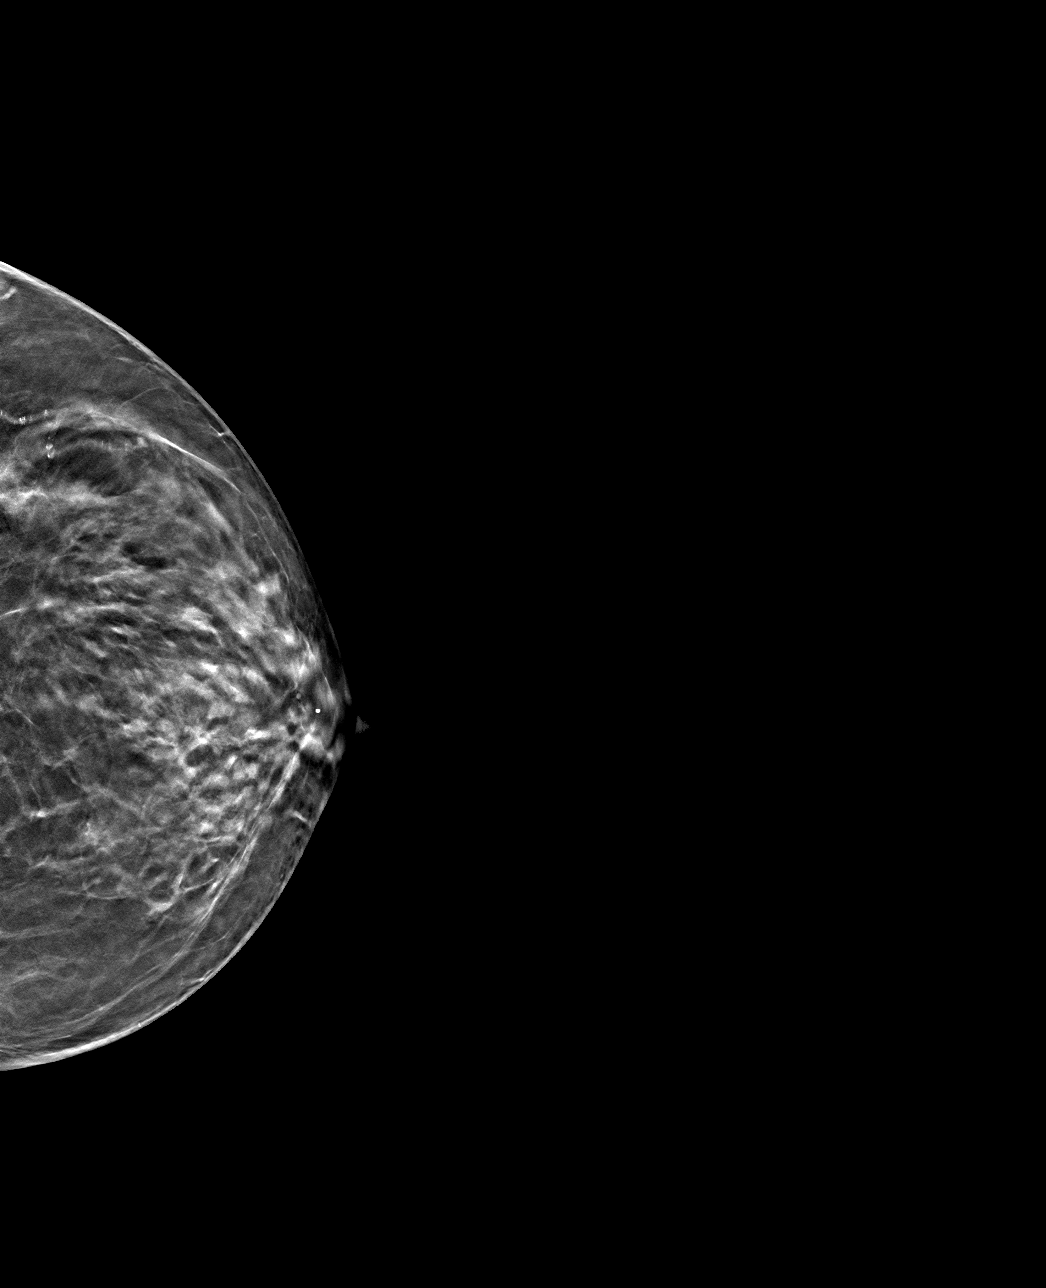

[L MLO tomo · tomo slice 34/67.0]
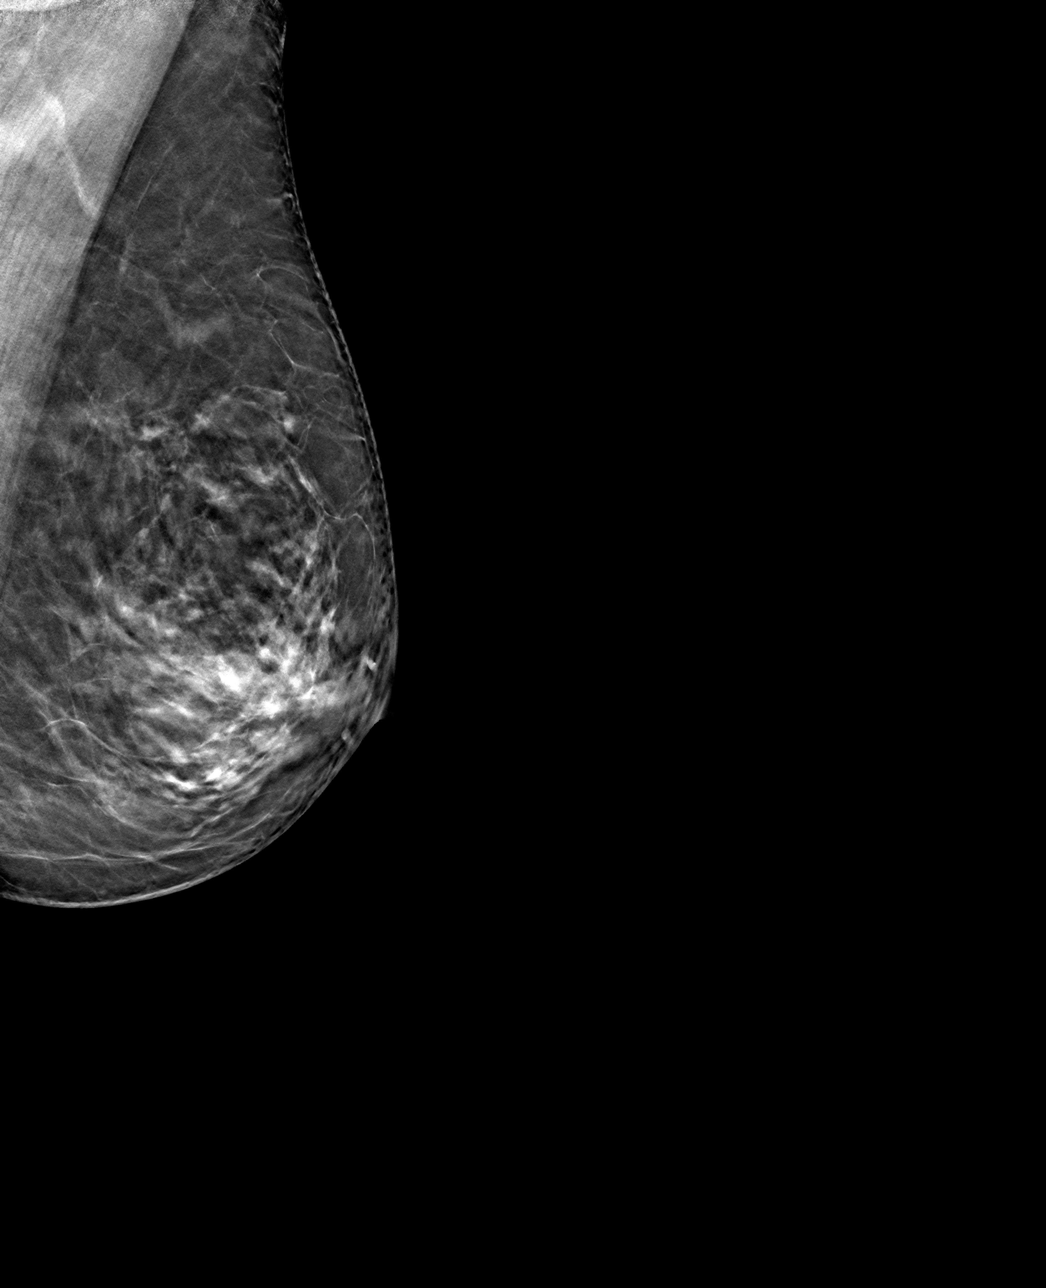

[4 of 12 positions shown; findings below may reference images not displayed]

FINDINGS: No suspicious mass, calcifications, or area of architectural 
distortion in the left breast.
IMPRESSION: Stable exam.  
(BI-RADS 2) Benign findings. Routine mammographic follow-up is recommended.

## 2022-03-10 IMAGING — DX KNEE 3 VIEWS RIGHT
1 series · 3 of 3 positions shown · non-contrast
Comparison: None.

________________________________________________________________________________________________ 
KNEE 3 VIEWS RIGHT, KNEE 3 VIEWS LEFT, 03/10/2022 [DATE]: 
CLINICAL INDICATION: Other Chronic Pain

[Series 1: ap int rot · U · 0.14mm/px · 3 of 3 slices shown]
[im 1/3]
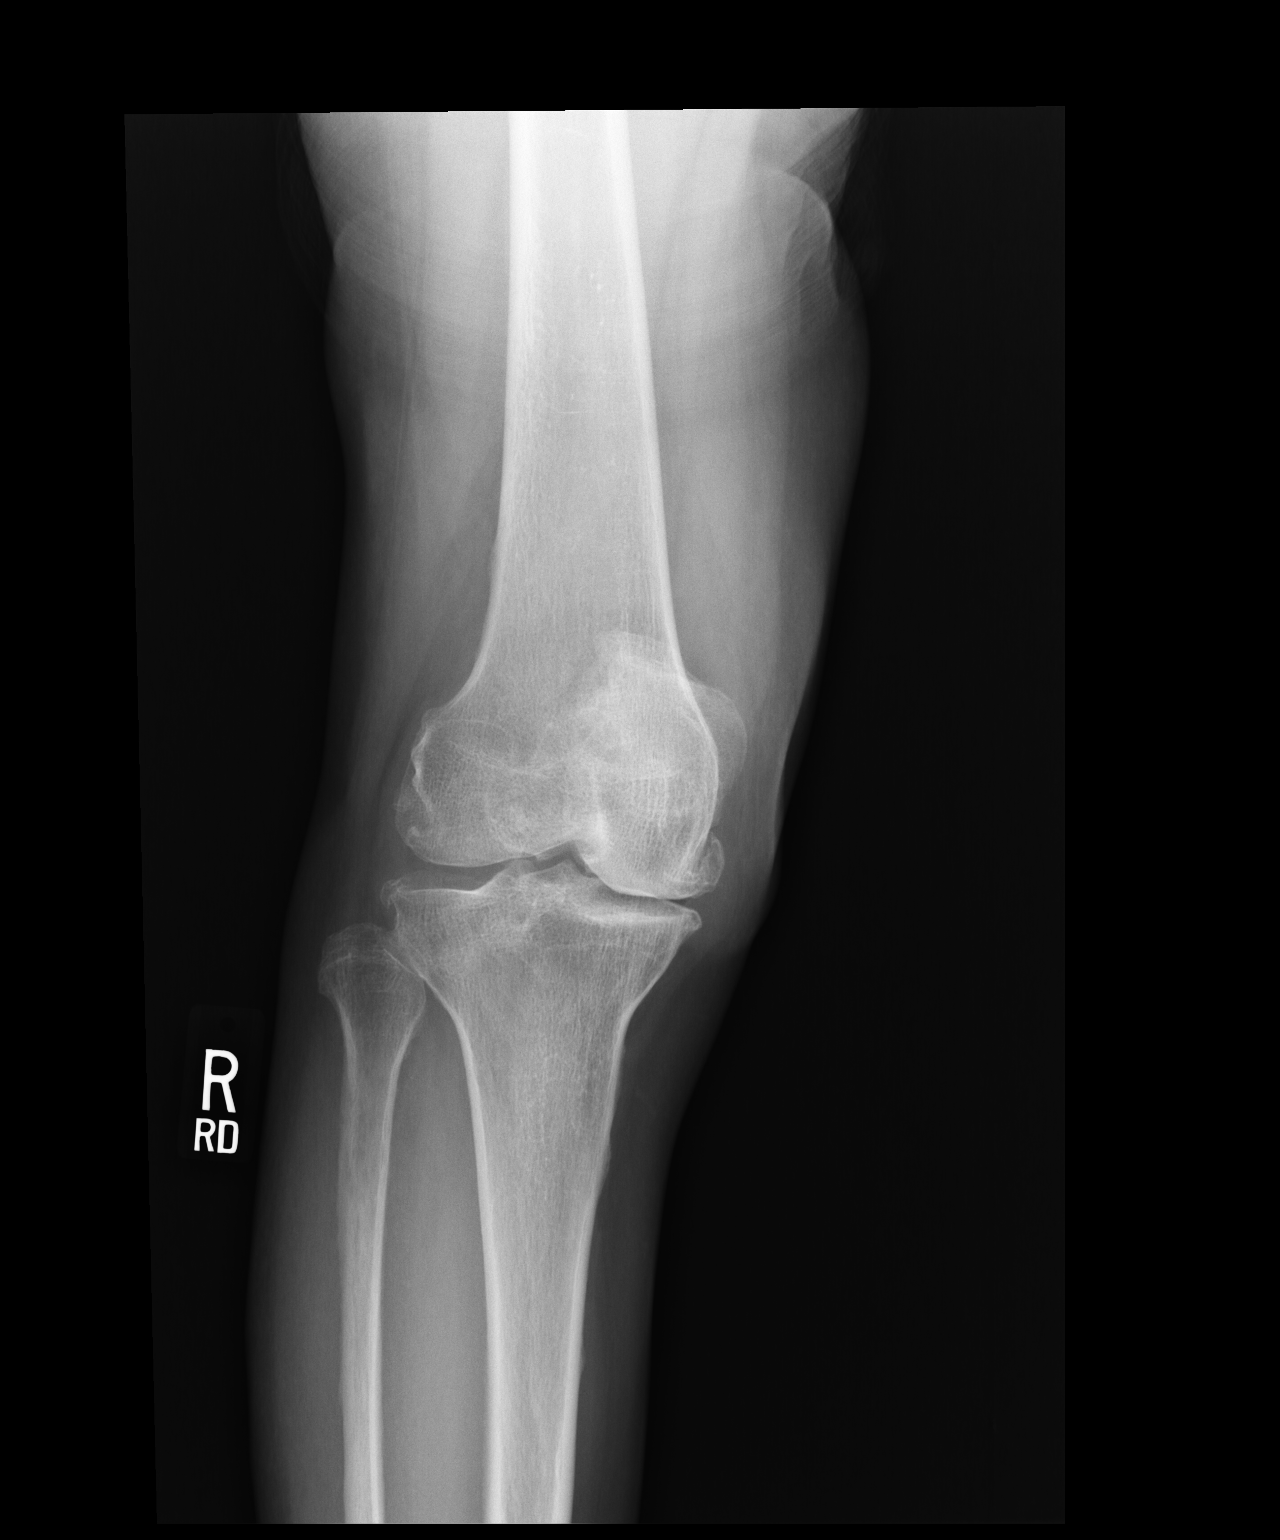
[im 2/3]
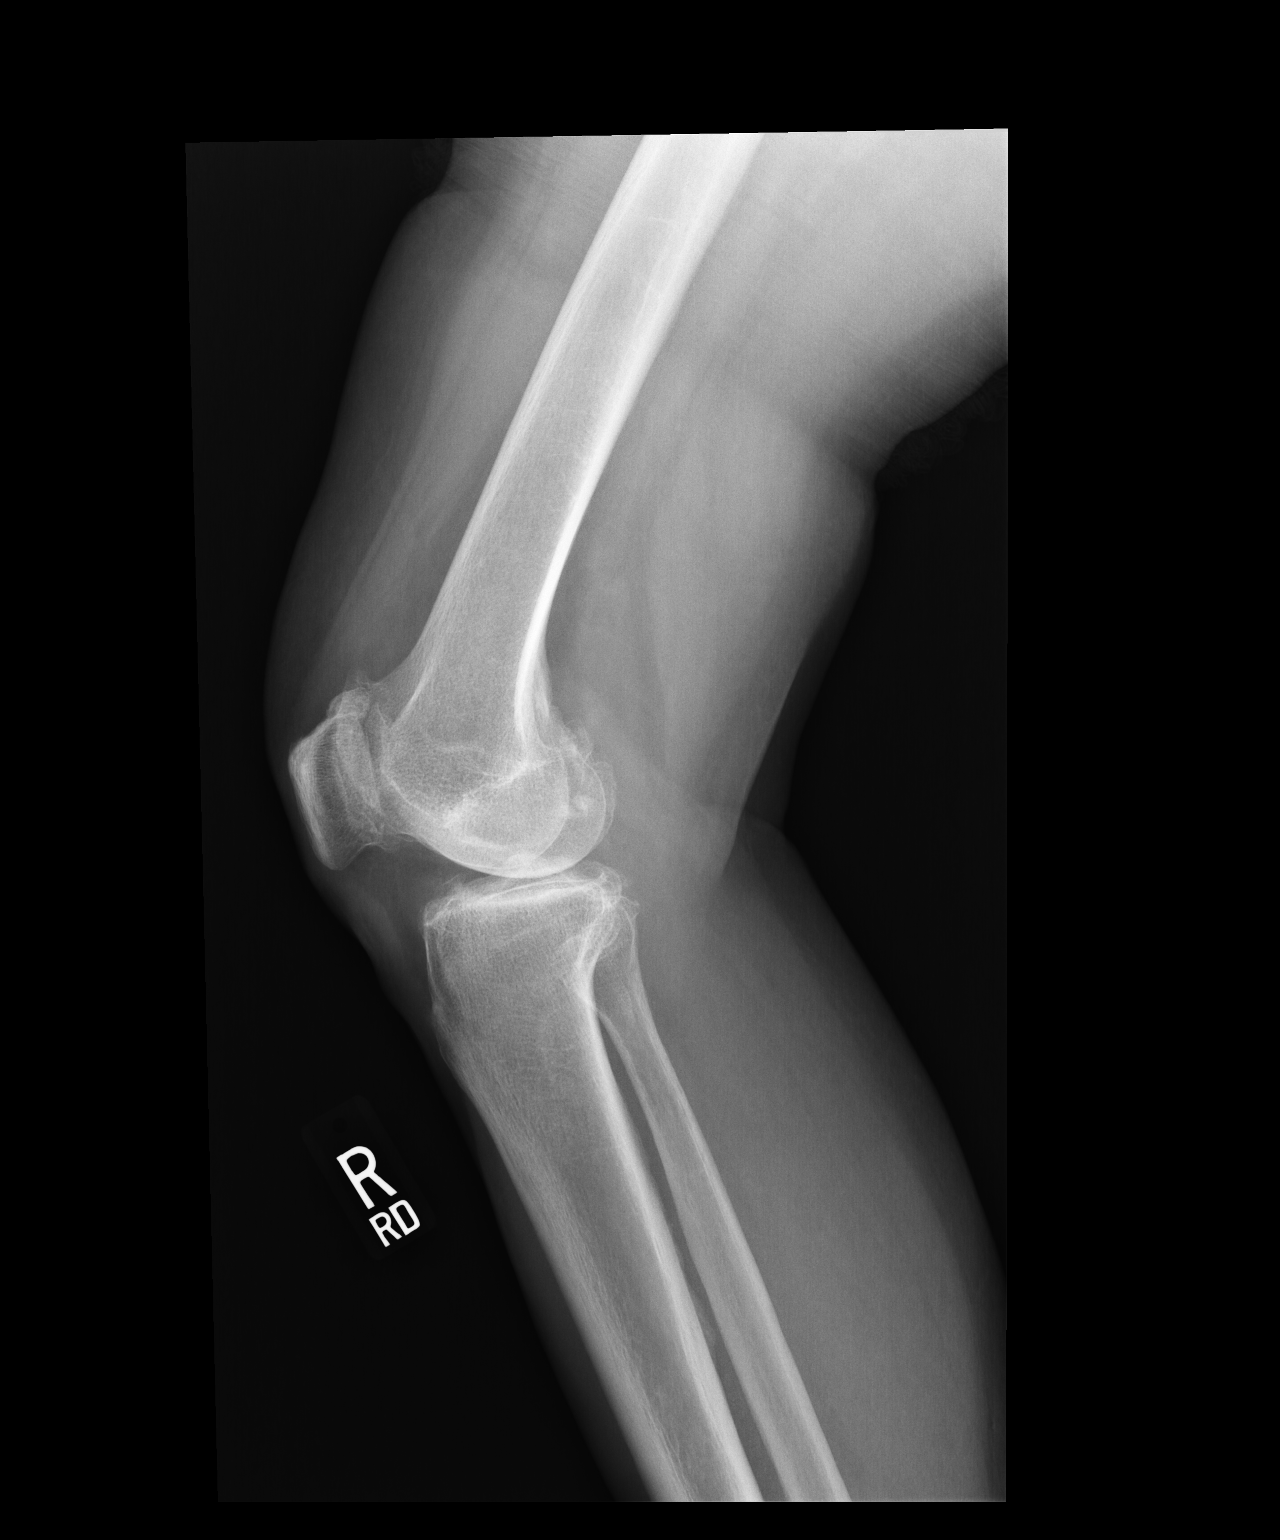
[im 3/3]
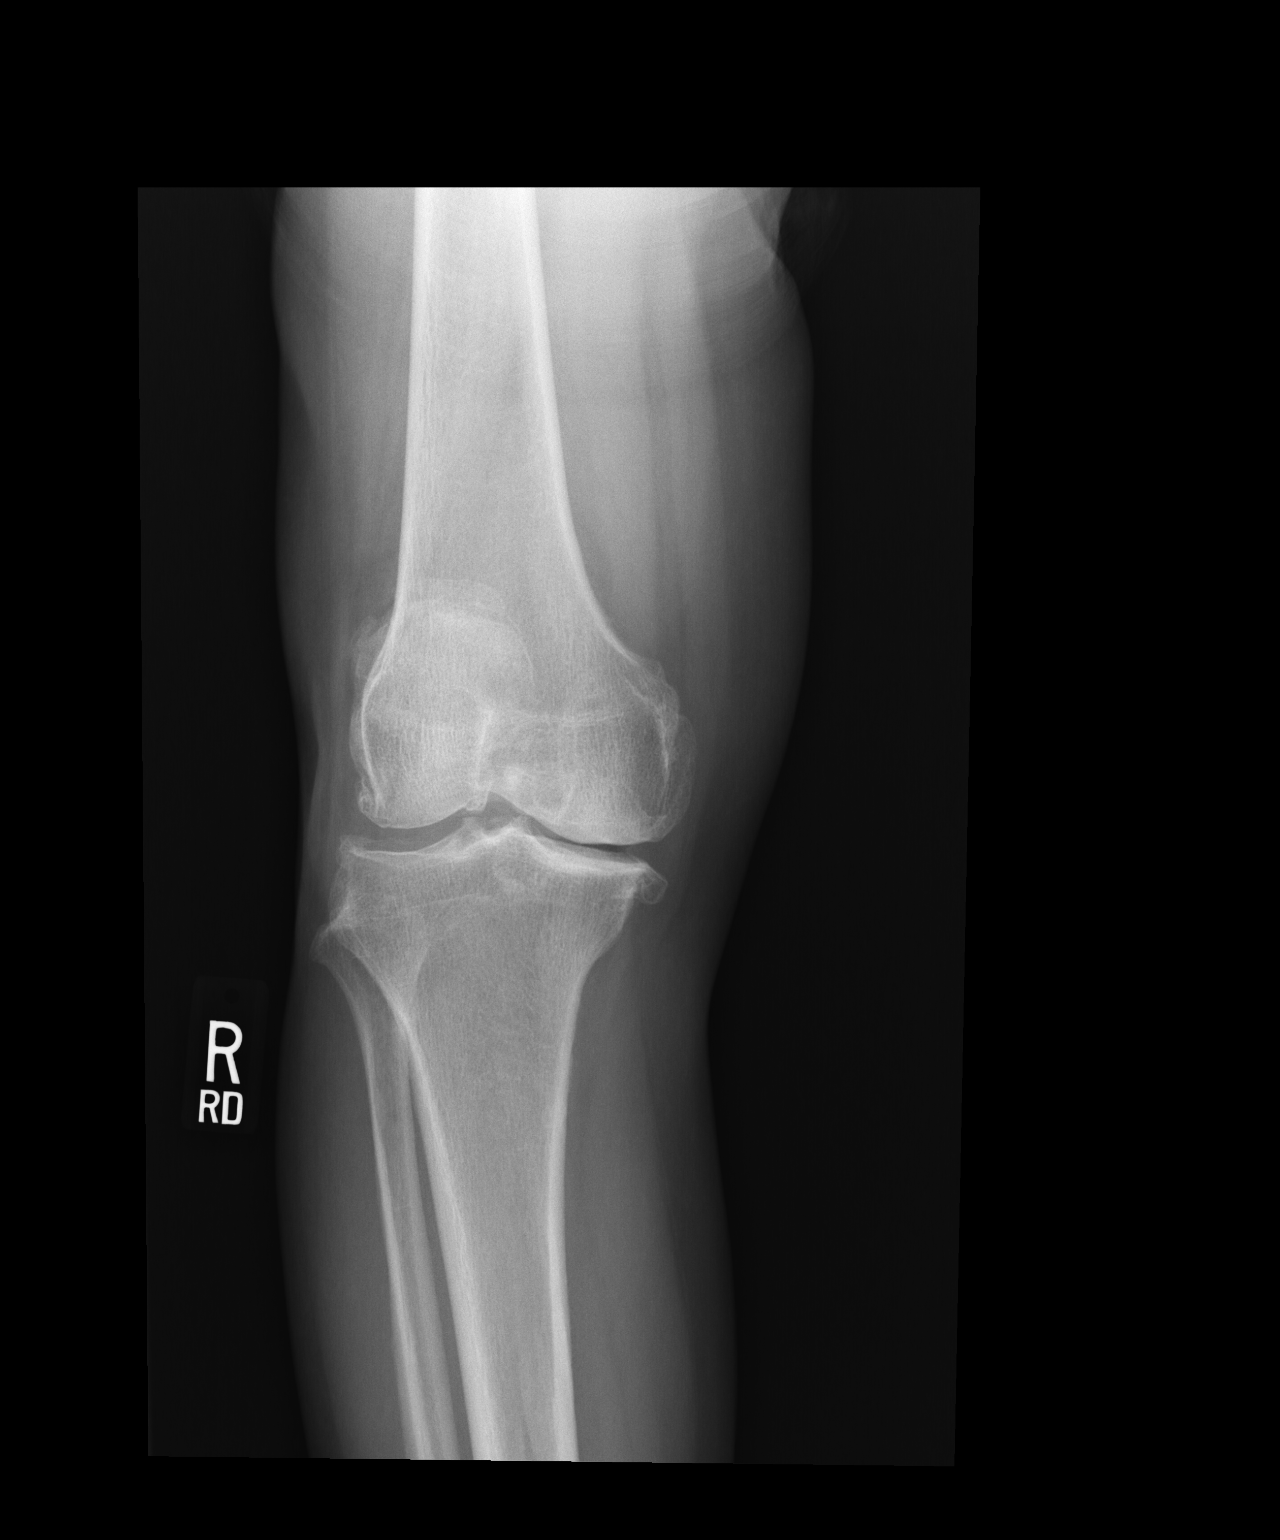

[3 of 3 positions shown; findings below may reference images not displayed]

FINDINGS: No fracture. Normal alignment. Moderate bilateral degenerative 
change. Bilateral intra-articular bodies. Small left joint effusion. Normal bone 
mineralization.
IMPRESSION: Marked degenerative change of the knees and small left joint effusion.

## 2022-03-10 IMAGING — DX KNEE 3 VIEWS LEFT
1 series · 3 of 3 positions shown · non-contrast
Comparison: None.

________________________________________________________________________________________________ 
KNEE 3 VIEWS RIGHT, KNEE 3 VIEWS LEFT, 03/10/2022 [DATE]: 
CLINICAL INDICATION: Other Chronic Pain

[Series 1: ap int rot · U · 0.14mm/px · 3 of 3 slices shown]
[im 1/3]
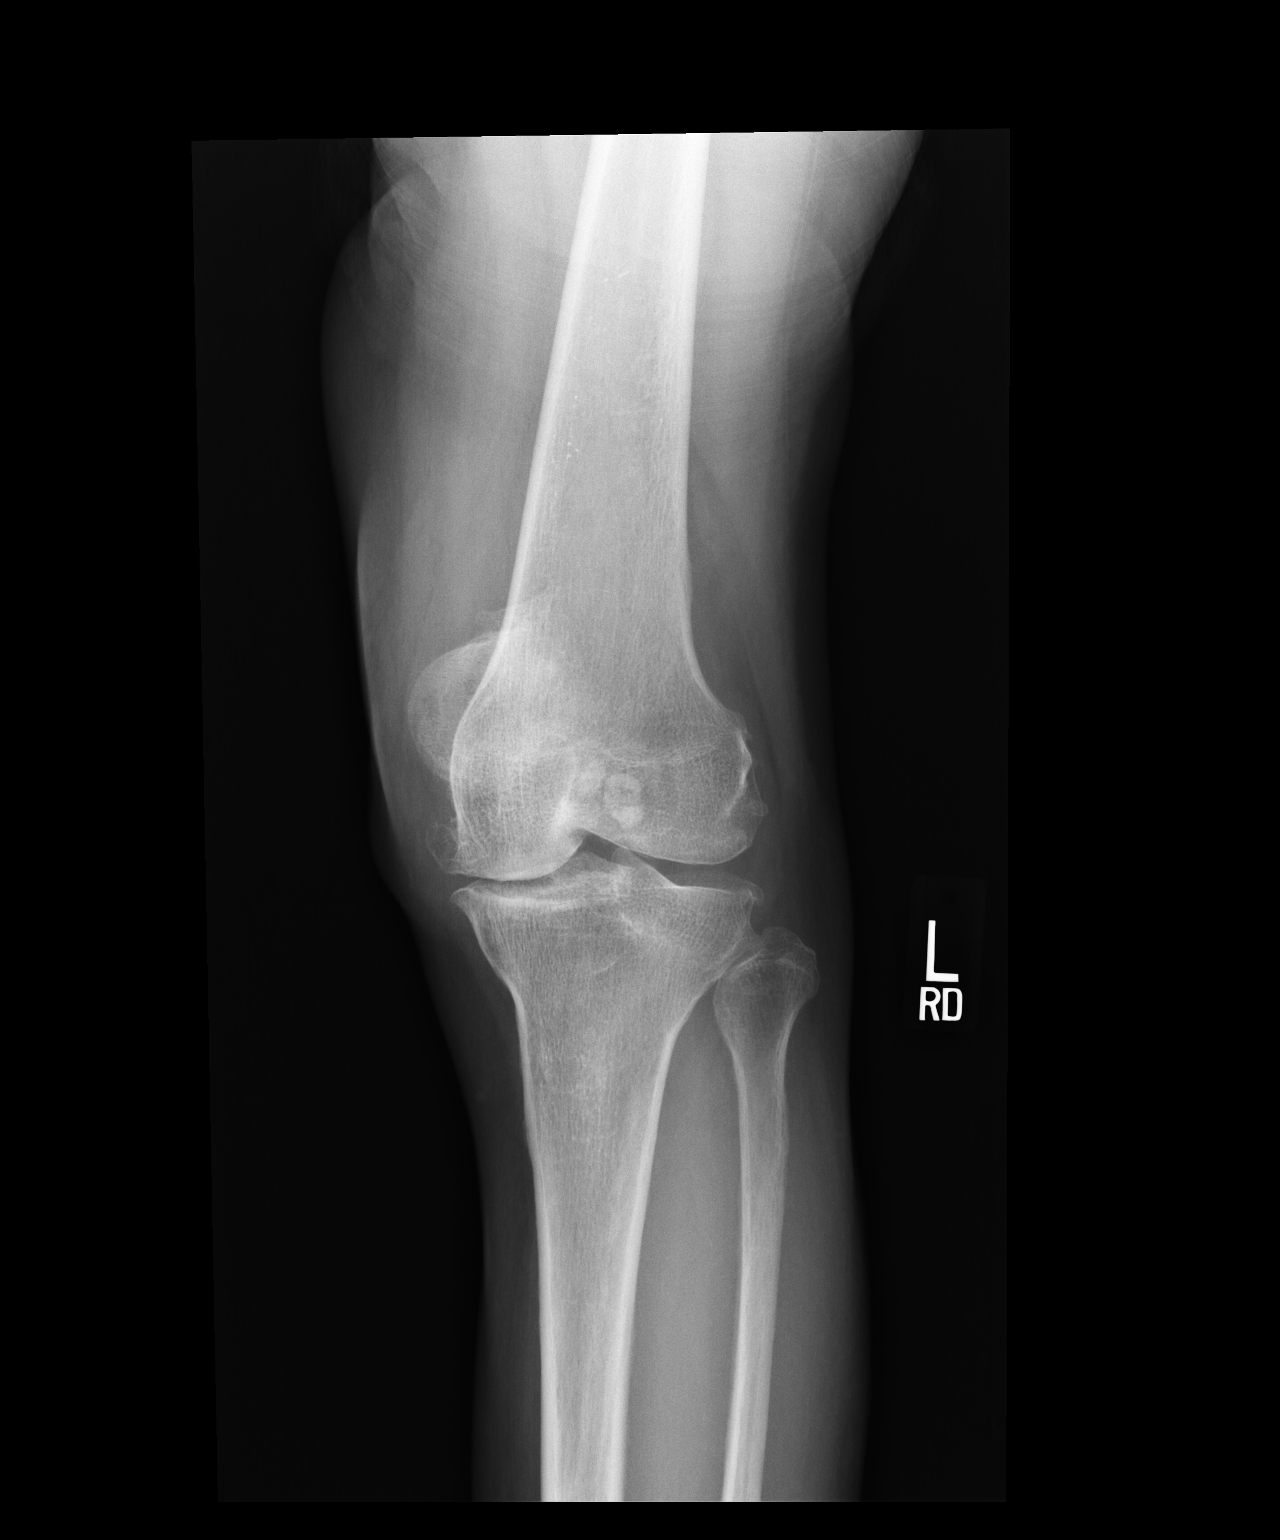
[im 2/3]
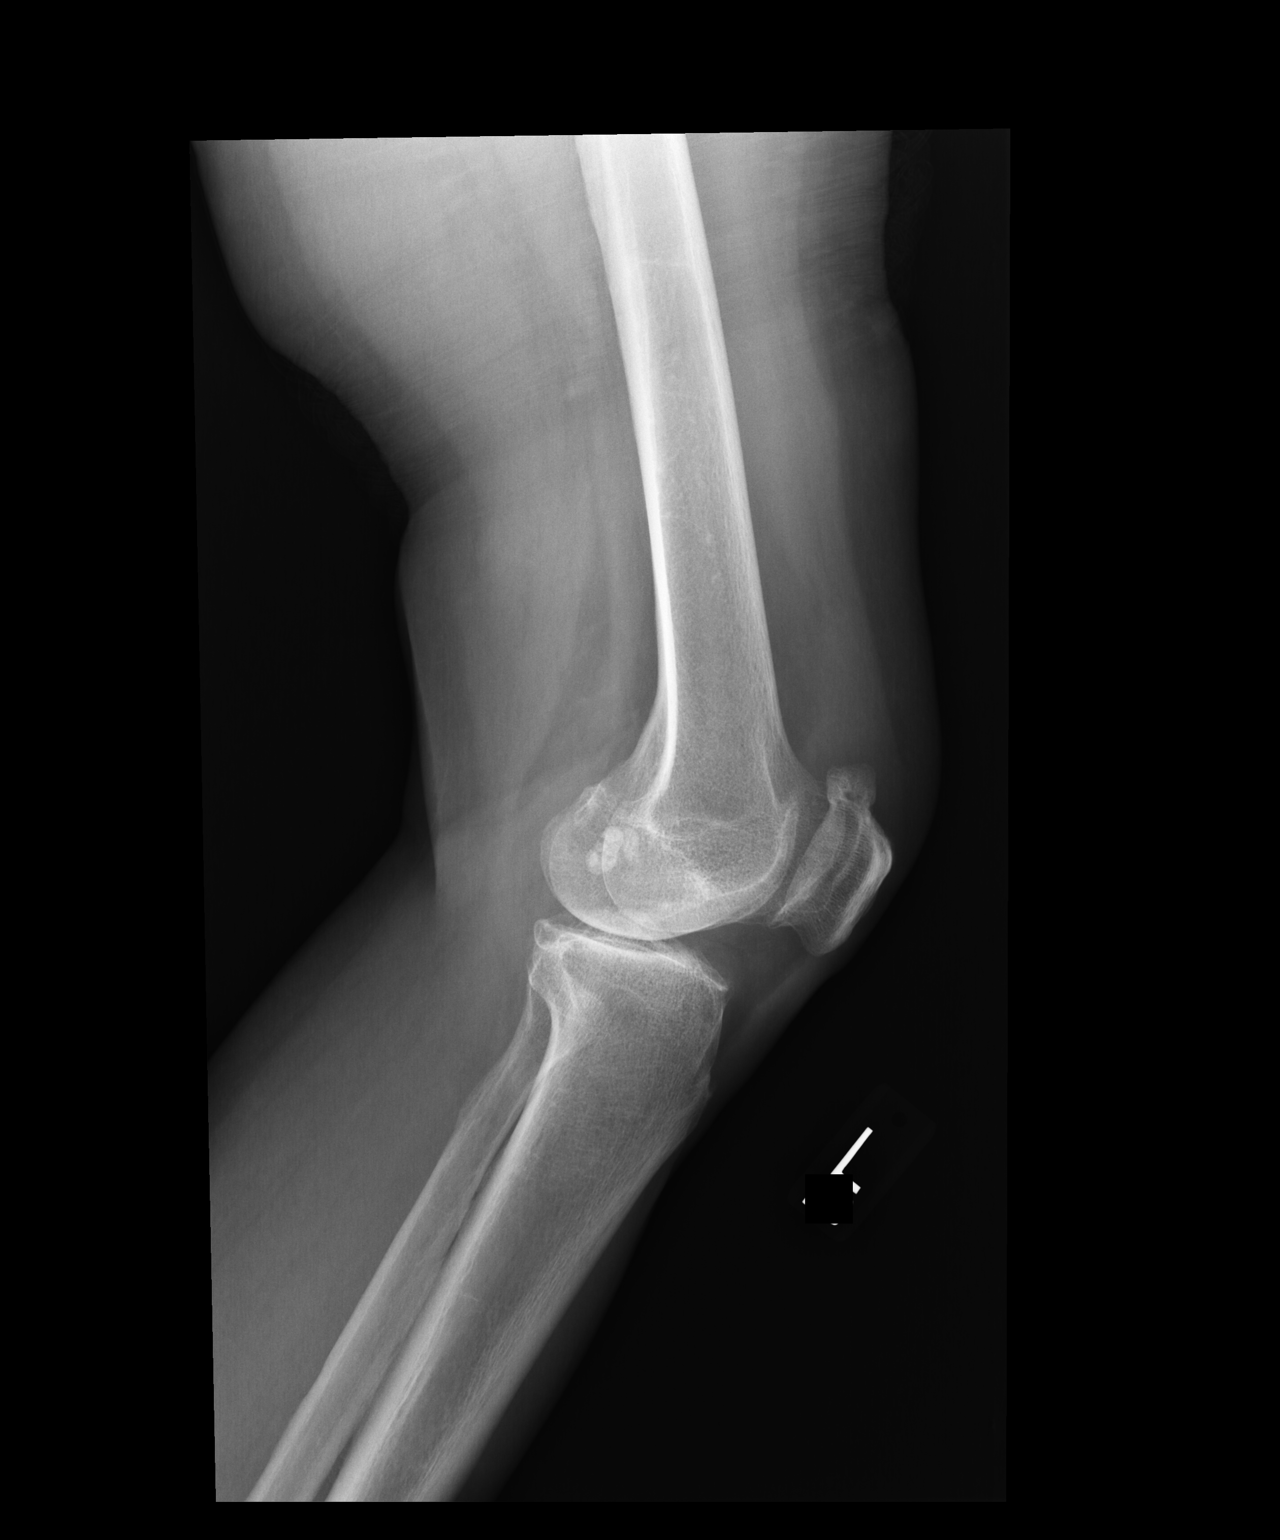
[im 3/3]
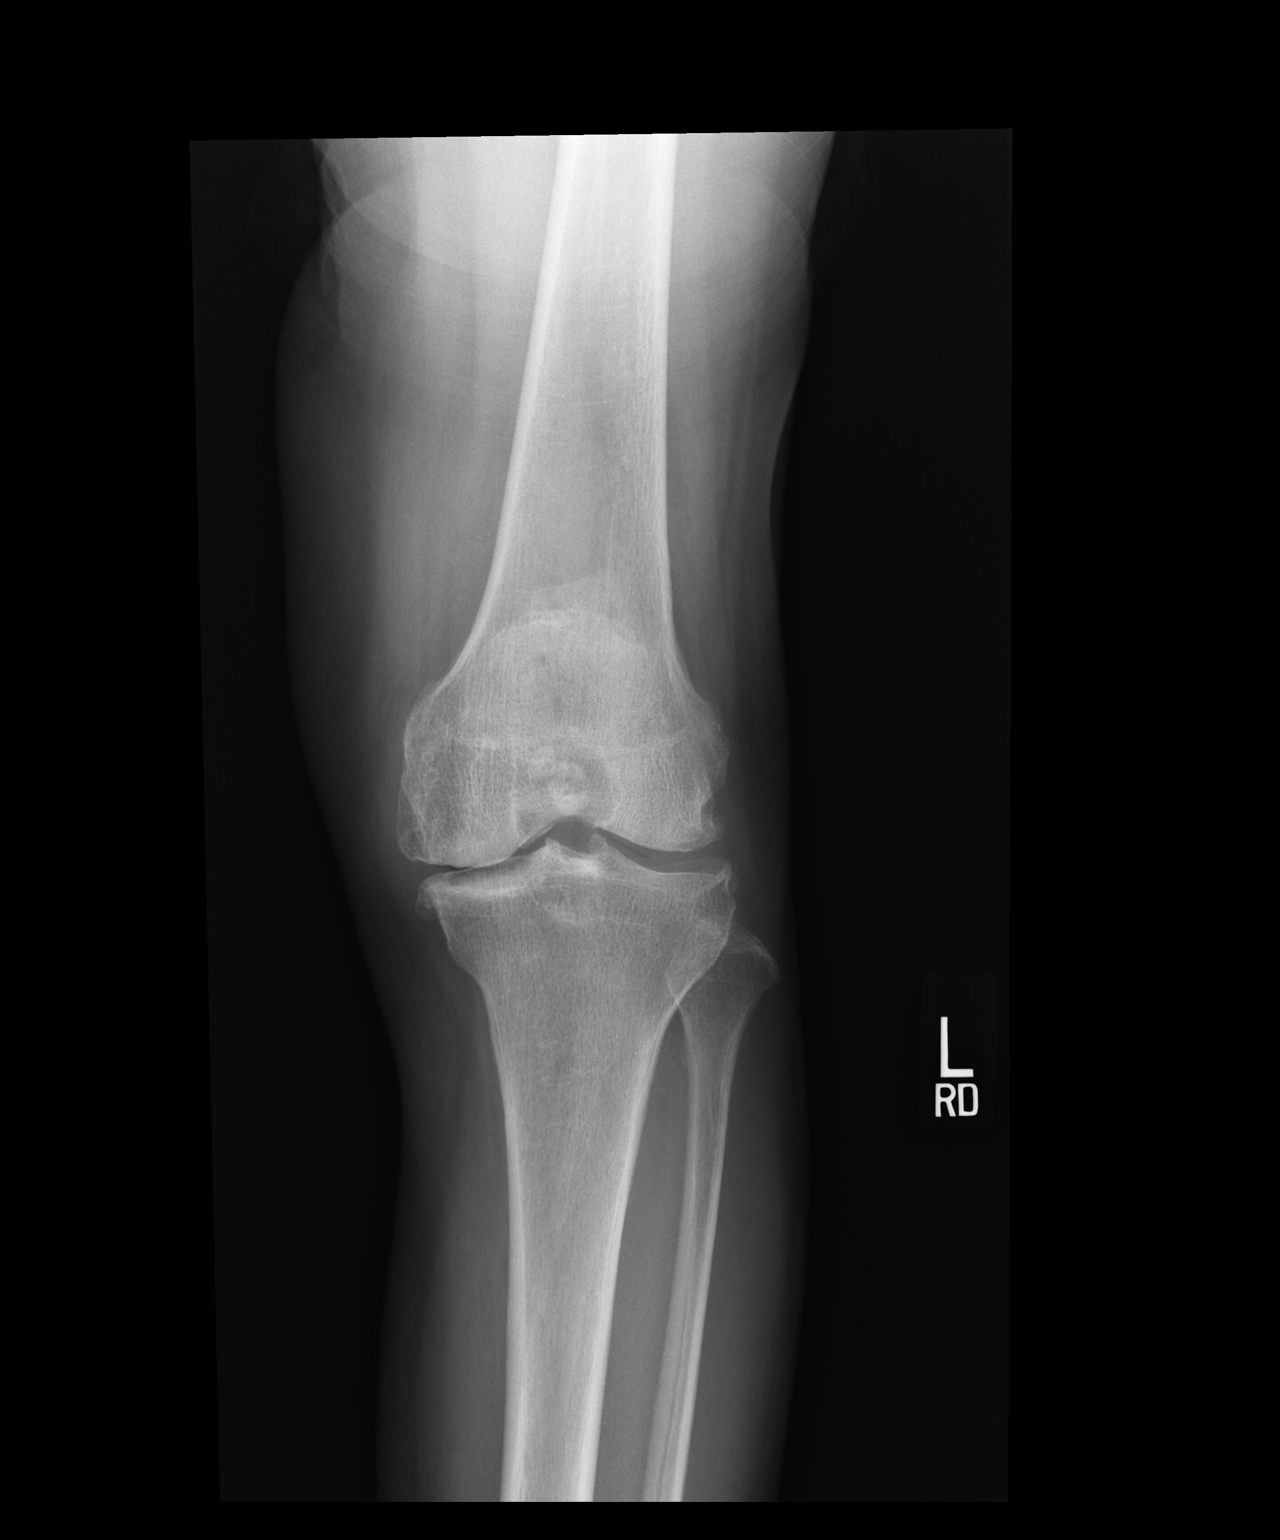

[3 of 3 positions shown; findings below may reference images not displayed]

FINDINGS: No fracture. Normal alignment. Moderate bilateral degenerative 
change. Bilateral intra-articular bodies. Small left joint effusion. Normal bone 
mineralization.
IMPRESSION: Marked degenerative change of the knees and small left joint effusion.

## 2022-06-16 IMAGING — MR MRI LUMBAR SPINE WITHOUT CONTRAST
7 of 9 series · 16 of 48 positions shown · IV contrast (gadolinium)
Comparison: Lumbar spine x-ray the May 13, 2022.

________________________________________________________________________________________________ 
MRI LUMBAR SPINE WITHOUT CONTRAST, 06/16/2022 [DATE]: 
CLINICAL INDICATION: Low back pain that radiates to both lower extremities. 
History of breast cancer. Evaluate for spondylolisthesis.
TECHNIQUE: Multiplanar, multiecho position MR images of the lumbar spine were 
performed without intravenous gadolinium enhancement. Patient was scanned on a 
1.5T magnet

[Series 101: survey · axial · 10.0mm · 1.25mm/px · z∈[-33,+201]mm · 2 of 10 slices shown]
[im 1/10]
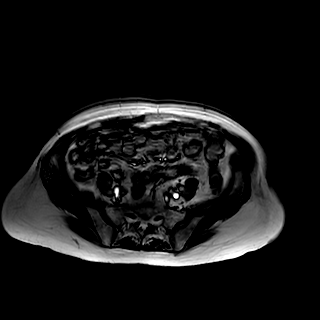
[im 10/10]
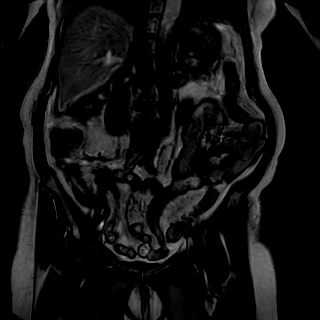

[Series 201: t2w_cor-surv · coronal · 6.0mm · 0.62mm/px · 2 of 10 slices shown]
[im 1/10]
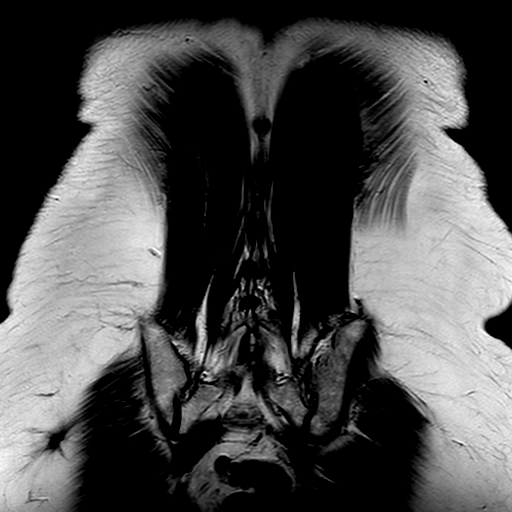
[im 10/10]
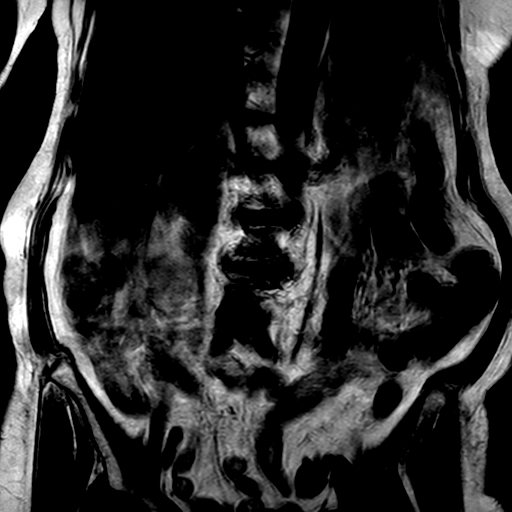

[Series 301: t1_tse_sag · sagittal · 4.0mm · 0.38mm/px · 2 of 17 slices shown]
[im 1/17]
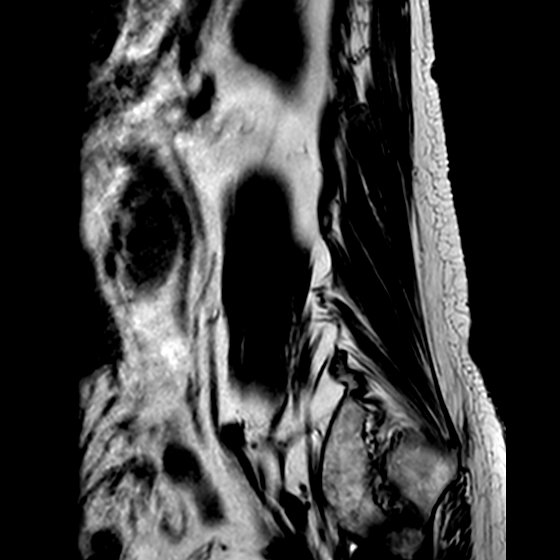
[im 17/17]
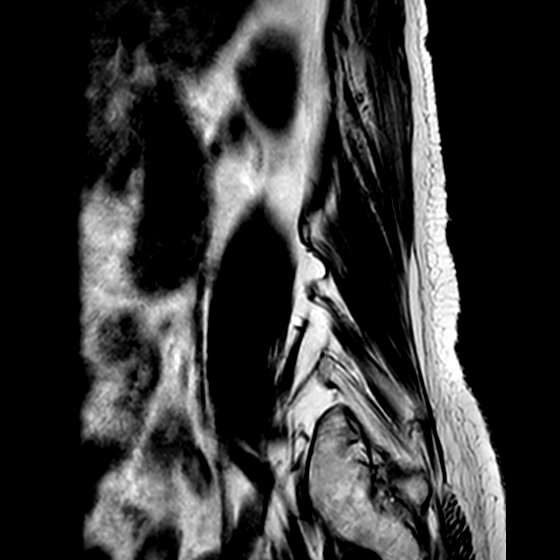

[Series 402: (id)_mdixon_tse · sagittal · 4.0mm · 0.48mm/px · 2 of 17 slices shown]
[im 1/17]
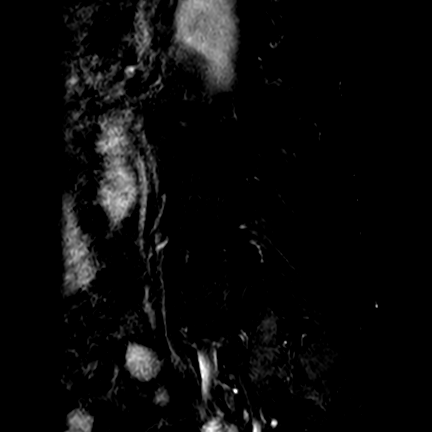
[im 17/17]
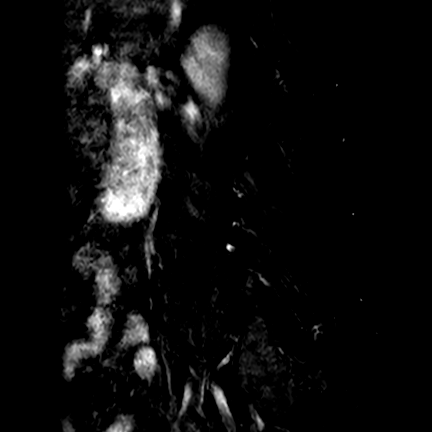

[Series 403: st2w_mdixon_tse · sagittal · 4.0mm · 0.48mm/px · 2 of 17 slices shown]
[im 1/17]
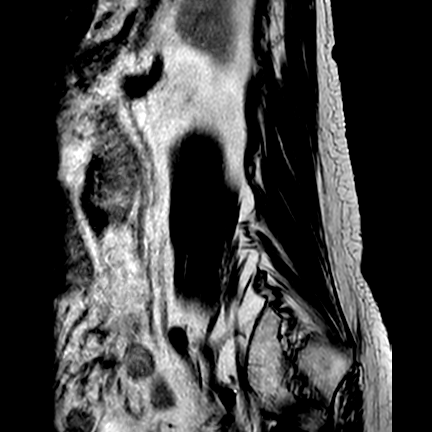
[im 17/17]
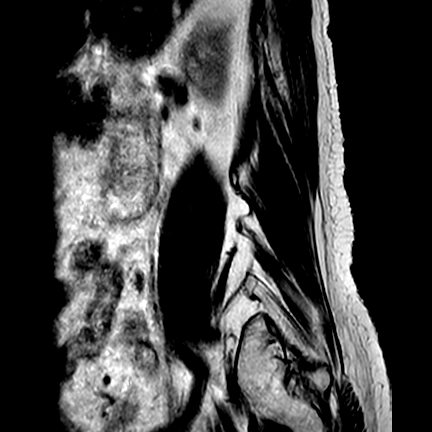

[Series 502: (id) view_ax mpr · axial · 1.0mm · 0.25mm/px · 1 of 126 slices shown]
[im 9/126]
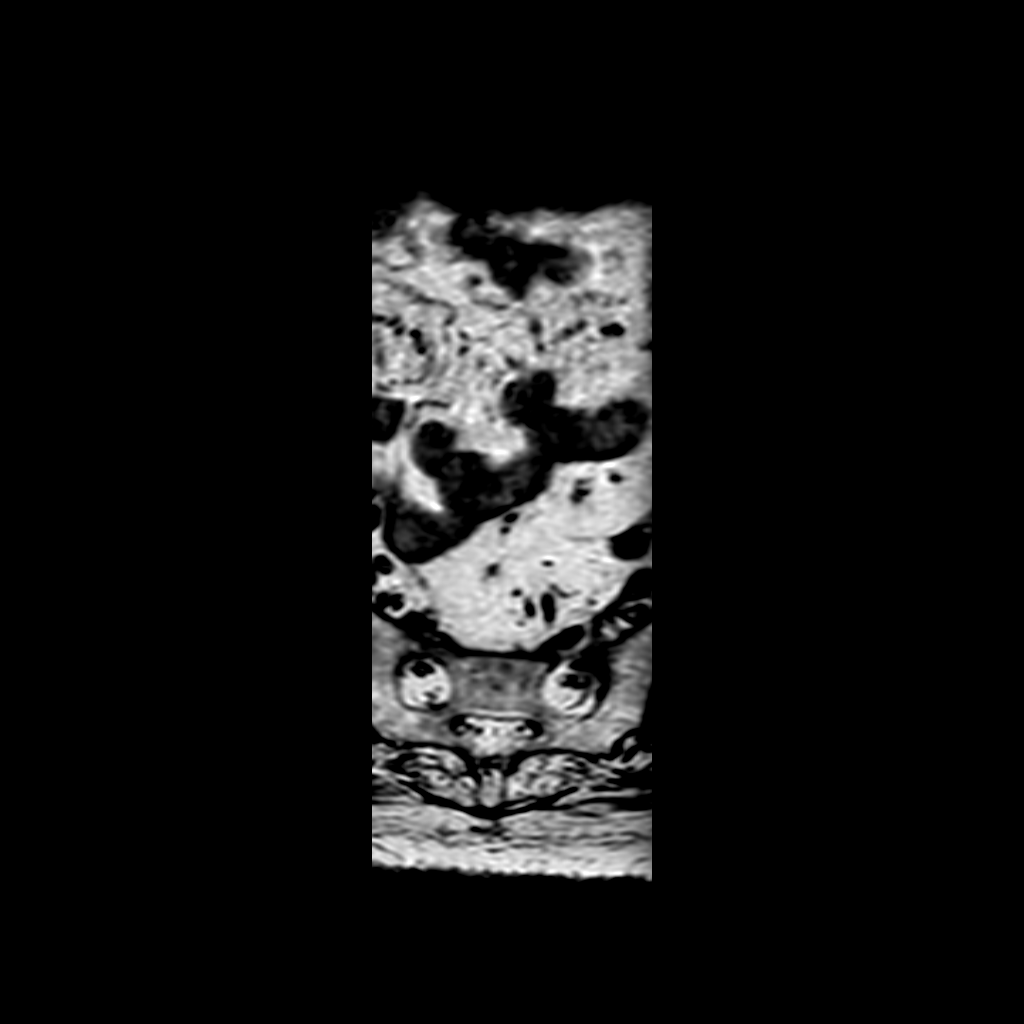

[Series 701: T1 · axial · 4.0mm · 0.38mm/px · z∈[-29,+146]mm · 5 of 40 slices shown]
[im 1/40]
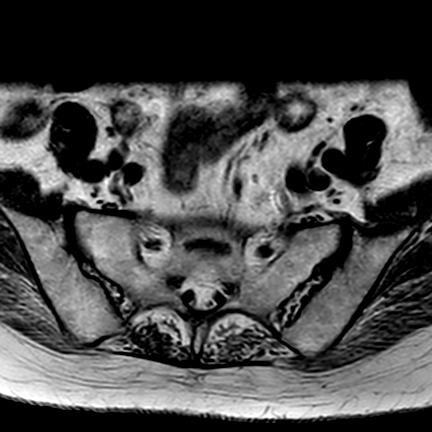
[im 10/40]
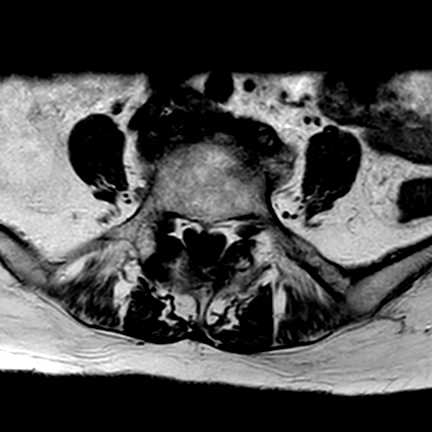
[im 20/40]
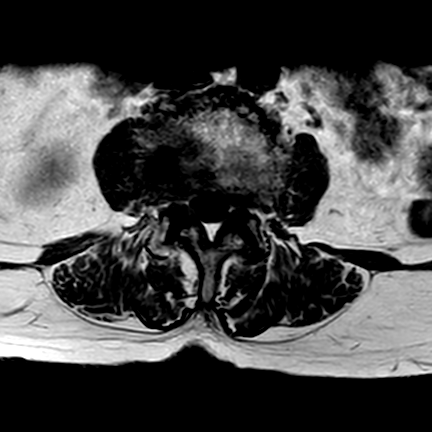
[im 30/40]
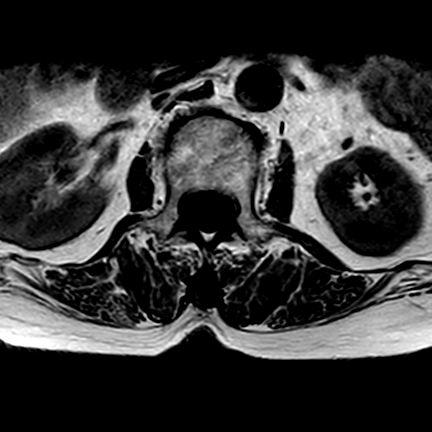
[im 40/40]
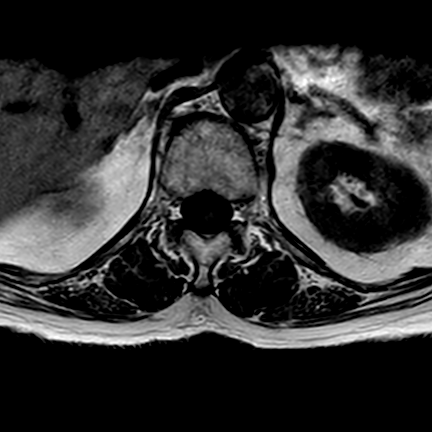

[16 of 48 positions shown; findings below may reference images not displayed]

FINDINGS: -------------------------------------------------------------------------------- 
------ 
GENERAL: 
Transitional anatomy. There is an articulation along the left lateral aspect L5, 
and to the sacrum.     
ALIGNMENT: Minimal levoconvex thoracic lumbar scoliosis. 5 mm anterolisthesis L4 
on L5 is degenerative. No pars defect. 
VERTEBRAL BODY HEIGHT: Normal.  
MARROW SIGNAL: No focal suspect signal abnormality. 
CORD SIGNAL: Normal distal spinal cord and cauda equina. Conus medullaris 
terminates at T12-L1. 
ADDITIONAL FINDINGS: Colonic diverticulosis. Subcentimeter left renal cyst. 7 mm 
right hepatic cyst. 
Modic I-II: L3-L4 
Ligamentum Flavum > 2.5 mm: All levels. 
-------------------------------------------------------------------------------- 
------ 
SEGMENTAL: 
T12-L1: Loss of disc signal. Minimal annular bulge. Small left foraminal disc 
protrusion. Canal and foramina are patent. 
L1-L2: Loss of disc signal. Minimal annular bulge. Canal and foramina are patent 
with normal facets. 
L2-L3: Mild loss of disc height with loss of disc signal. Borderline canal 
stenosis. Mild annular bulge. Right foramen is patent. Left foramen is mildly 
narrowed. 
L3-L4: Moderate to severe loss of disc height to the right. Loss of disc signal. 
Endplate signal changes with Schmorls node. Moderate canal stenosis with 
lateral recess narrowing bilaterally. Diffuse mild annular bulging. Ligamentum 
flavum hypertrophy. Mild left and moderate right foraminal narrowing. 
L4-L5: Loss of disc signal. Anterolisthesis with disc uncovering. Moderately 
severe canal stenosis with cauda equina compression. Canal measures 4 mm in AP 
dimension. Lateral recess narrowed bilaterally. Disc bulges and inferior aspect 
of each neural foramen creating moderately severe left and mild to moderate 
right foraminal narrowing. Advanced facet arthropathy with ligamentum flavum 
hypertrophy and small bilateral facet joint effusions. 
L5-S1: Osseous bridging across the disc space. Severe loss of disc height. 
Posterior osteophytic ridging. There is narrowing of the lateral recesses 
bilaterally. Canal is otherwise patent. Mild left foraminal narrowing. Right 
foramen is patent. Mild facet arthropathy. 
-------------------------------------------------------------------------------- 
------
IMPRESSION: Transitional anatomy. Please see above discussion. 
Degenerative and scoliotic changes. 
Most significant canal stenosis is at L4-L5, moderately severe with cauda equina 
compression and lateral recess narrowing bilaterally. There is 5 mm 
anterolisthesis L4 on L5. Moderate to severe left and mild-to-moderate right 
foraminal narrowing. 
Less significant and variable degrees of canal, lateral recess, and foraminal 
narrowing detailed above.

## 2022-06-22 IMAGING — CT CT LUMBAR SPINE WITHOUT CONTRAST
3 of 4 series · 10 of 33 positions shown, 12 images · non-contrast
Comparison: MRI lumbar spine from June 17, 2022.

________________________________________________________________________________________________ 
CT LUMBAR SPINE WITHOUT CONTRAST, 06/22/2022 [DATE]: 
CLINICAL INDICATION: Low back pain that radiates to bilateral lower extremities. 
A search for DICOM formatted images was conducted for prior CT imaging studies 
completed at a non-affiliated media free facility.
TECHNIQUE: The lumbar spine was scanned from T12 through mid-sacrum without 
contrast on a high-resolution CT scanner using dose reduction techniques. 
Routine MPR reconstructions were performed.

[Series 3: axial st · axial · 0.28mm/px · z∈[-292,-106]mm · 4 of 135 slices shown, 5 images]
[im 21/135  soft-tissue]
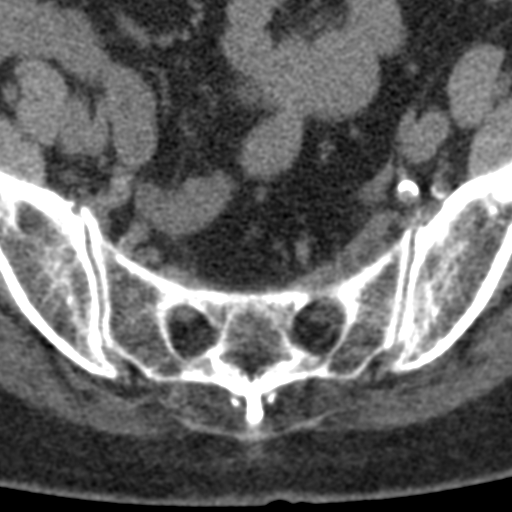
[im 21/135  bone]
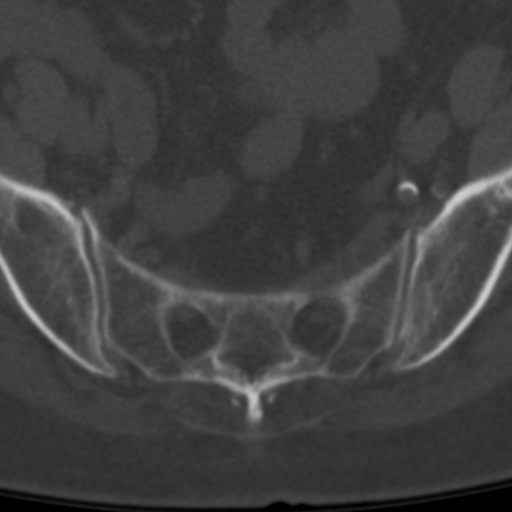
[im 52/135  bone]
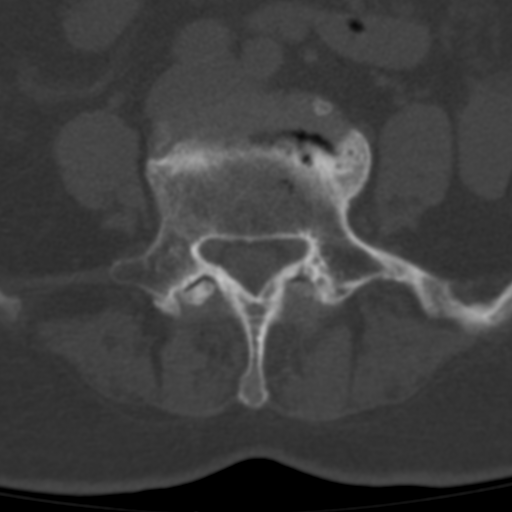
[im 83/135  bone]
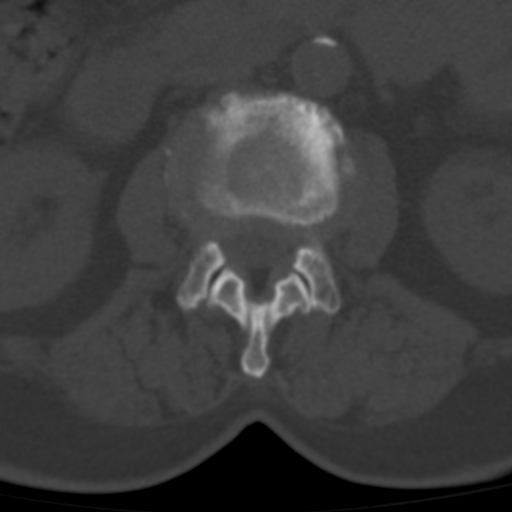
[im 114/135  bone]
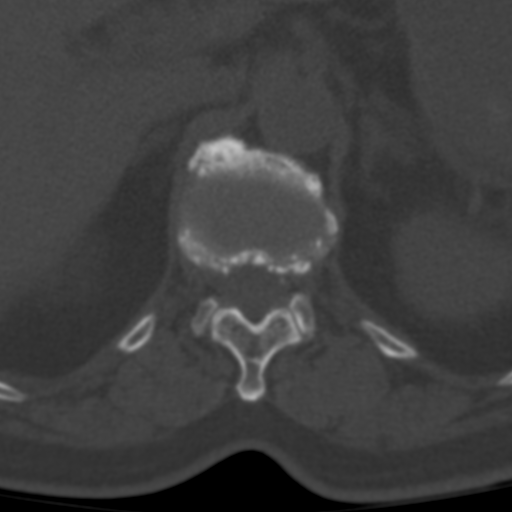

[Series 5: cor · coronal · 0.27mm/px · 1 of 63 slices shown]
[im 32/63  bone]
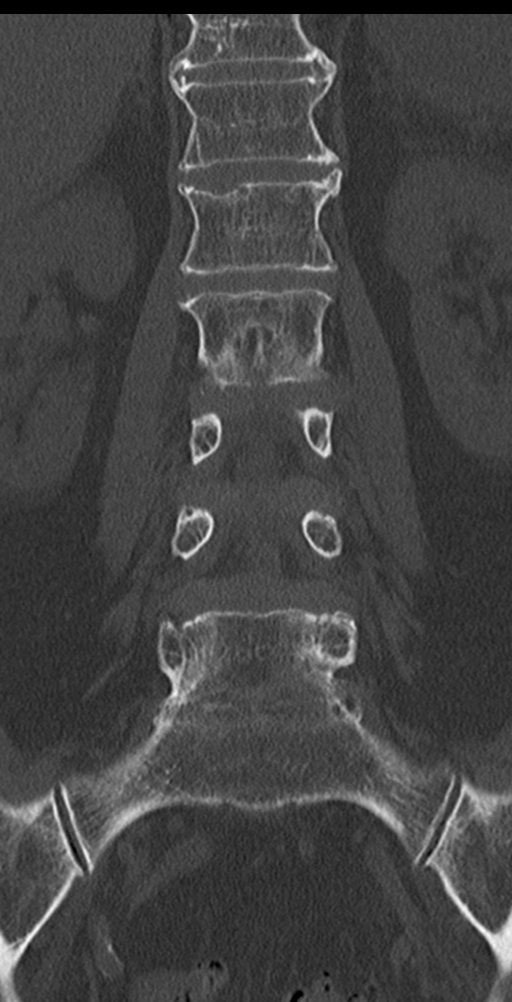

[Series 7: sag st · sagittal · 0.28mm/px · 5 of 71 slices shown, 6 images]
[im 24/71  bone]
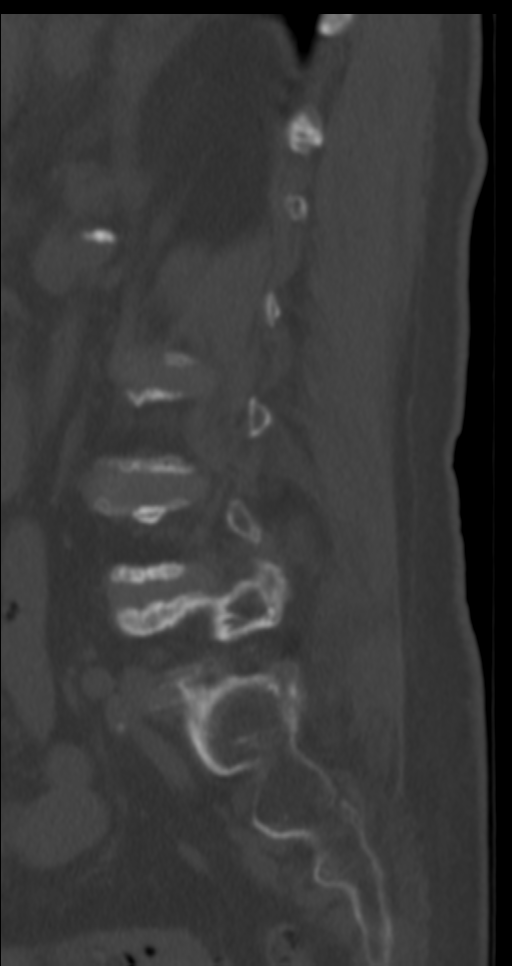
[im 30/71  bone]
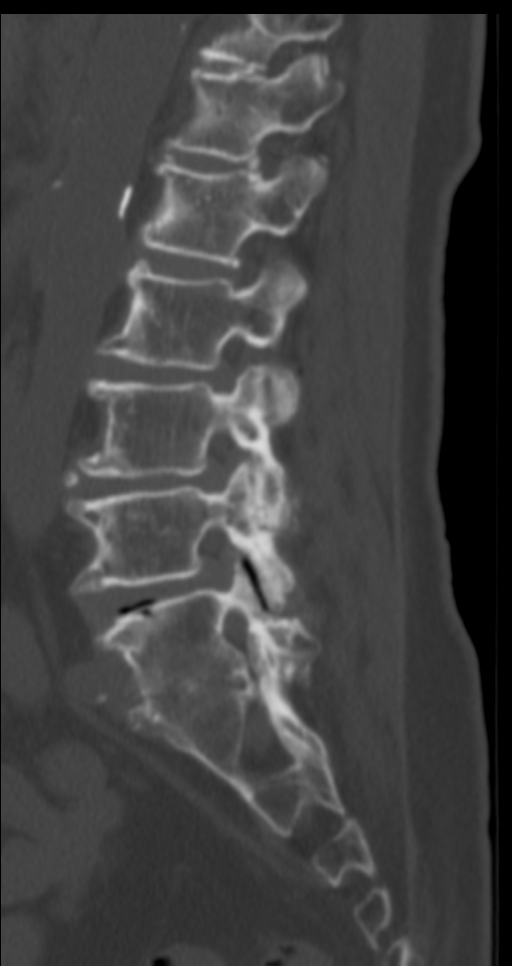
[im 36/71  soft-tissue]
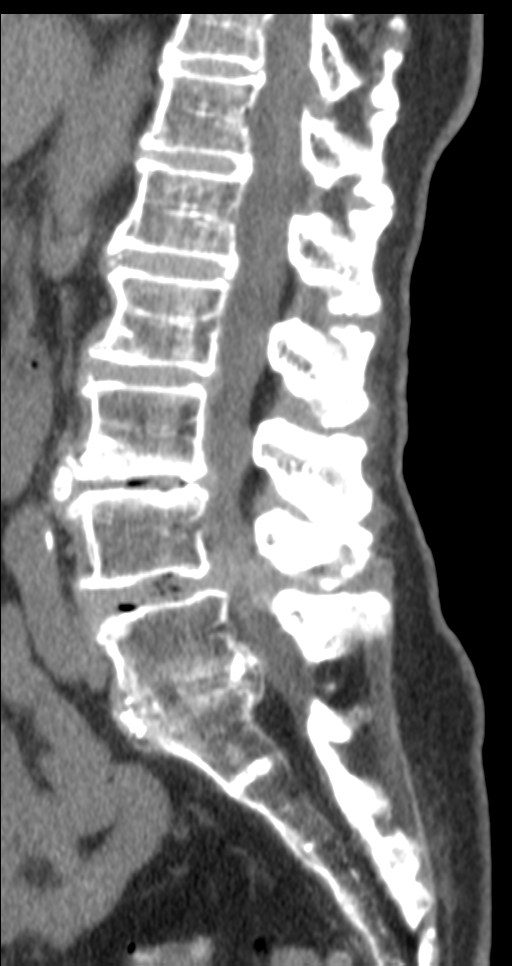
[im 36/71  bone]
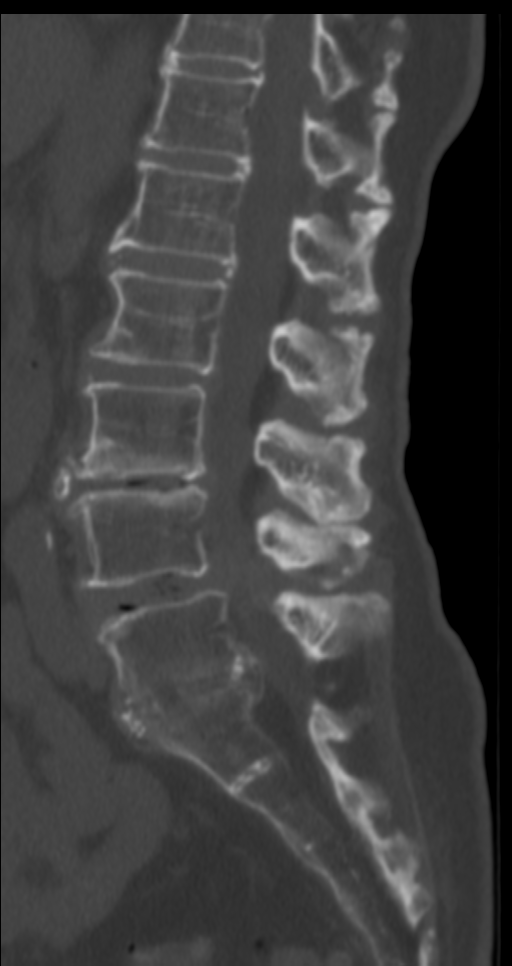
[im 41/71  bone]
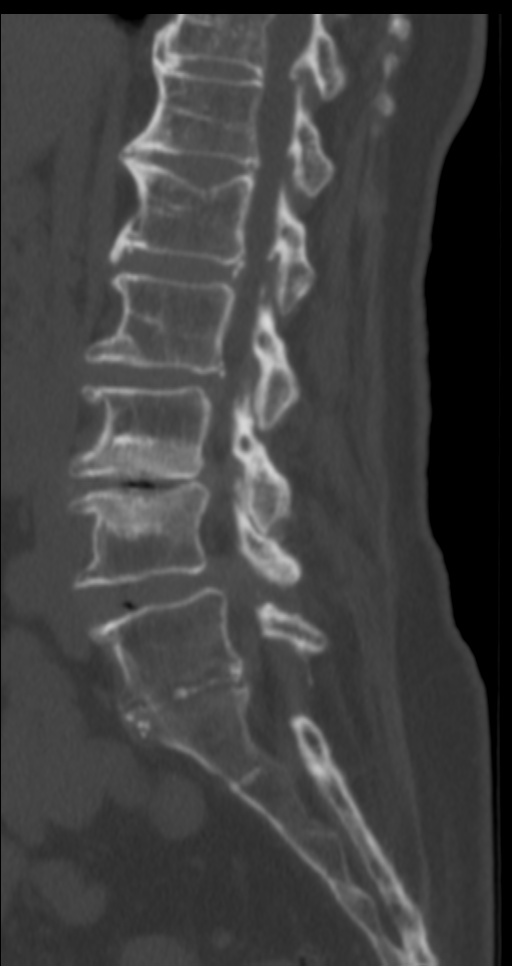
[im 47/71  bone]
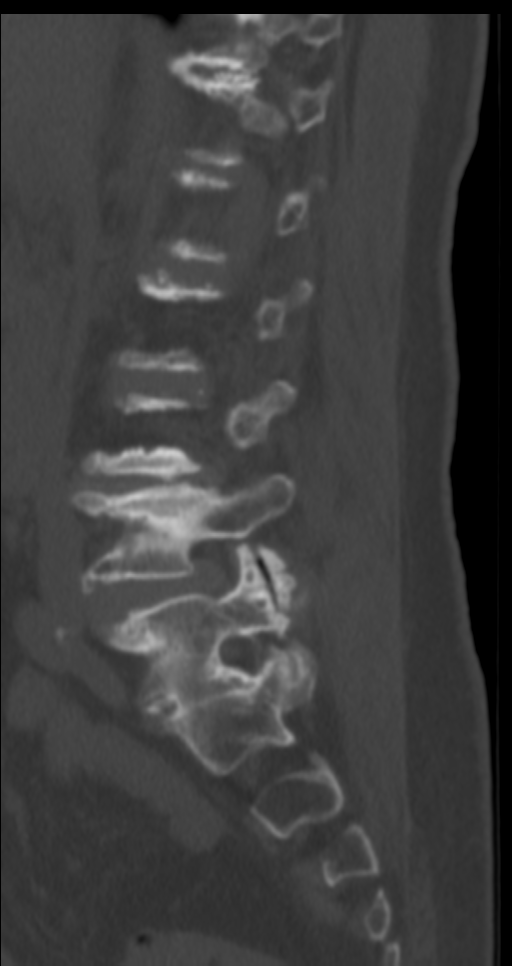

[10 of 33 positions shown; findings below may reference images not displayed]

Count of known CT and Cardiac Nuclear Medicine studies performed in the previous 
12 months = 0.
FINDINGS: -------------------------------------------------------------------------------- 
------ 
Lumbar spine alignment demonstrates stable minimal retrolisthesis of L2 on L3. 
Stable mild anterolisthesis of L4 on L5. No acute fracture. No focal suspect 
lytic or blastic lesion. Schmorls node along the superior endplate of L1 
towards the right side is stable. There is osseous fusion across the L5-S1 disc 
space. Osseous fusion present across the left L5-S1 facet joint with fusion 
between the left L5 transverse process and the left sacral ala. Vacuum disc 
phenomenon with disc space narrowing at L3-L4. Vacuum disc phenomenon also 
present at L4-L5. Visualized extraspinal soft tissues reveal vascular 
calcifications and sigmoid diverticulosis. 
Multilevel discogenic/degenerative changes are present. No change from prior 
recent MRI. Stable severe central canal narrowing at L4-L5. 
-------------------------------------------------------------------------------- 
------
IMPRESSION: Lumbar degenerative changes with stable severe central canal narrowing at L4-L5. 
RADIATION DOSE REDUCTION: All CT scans are performed using radiation dose 
reduction techniques, when applicable.  Technical factors are evaluated and 
adjusted to ensure appropriate moderation of exposure.  Automated dose 
management technology is applied to adjust the radiation doses to minimize 
exposure while achieving diagnostic quality images.

## 2023-01-03 IMAGING — CT CT LUMBAR SPINE WITHOUT CONTRAST
3 of 4 series · 13 of 33 positions shown, 15 images · non-contrast
Comparison: CT exam of 06/22/2022 and MR exam of 06/16/2022.

________________________________________________________________________________________________ 
CT LUMBAR SPINE WITHOUT CONTRAST, 01/03/2023 [DATE]: 
CLINICAL INDICATION: Other low back pain. Follow-up postlumbar surgery 3 months 
ago. History of breast cancer with mastectomy. 
A search for DICOM formatted images was conducted for prior CT imaging studies 
completed at a non-affiliated media free facility.
TECHNIQUE: The lumbar spine was scanned from T12 through mid-sacrum without 
contrast on a high-resolution CT scanner using dose reduction techniques. 
Routine MPR reconstructions were performed.

[Series 3: axial st · axial · 0.27mm/px · z∈[-304,-120]mm · 5 of 139 slices shown, 7 images]
[im 24/139  soft-tissue]
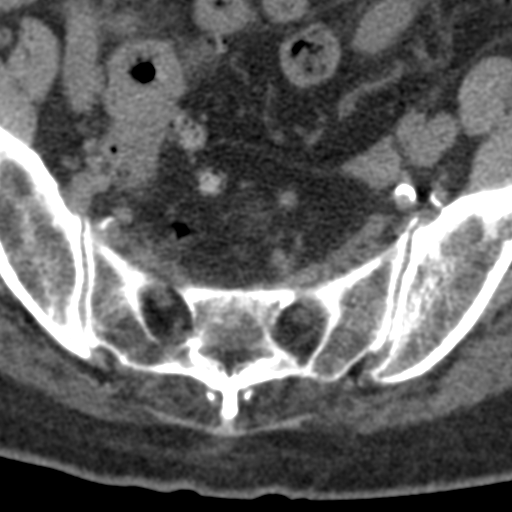
[im 24/139  bone]
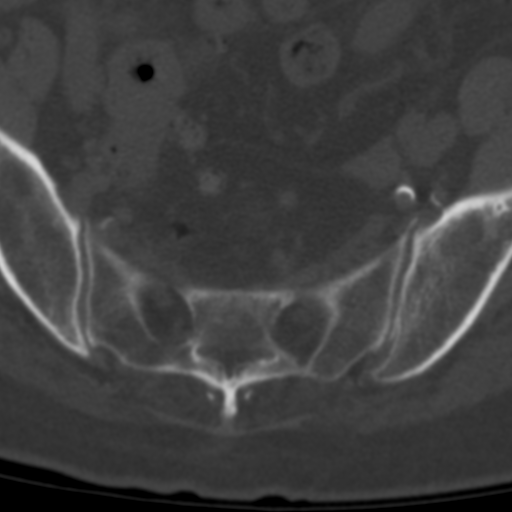
[im 47/139  bone]
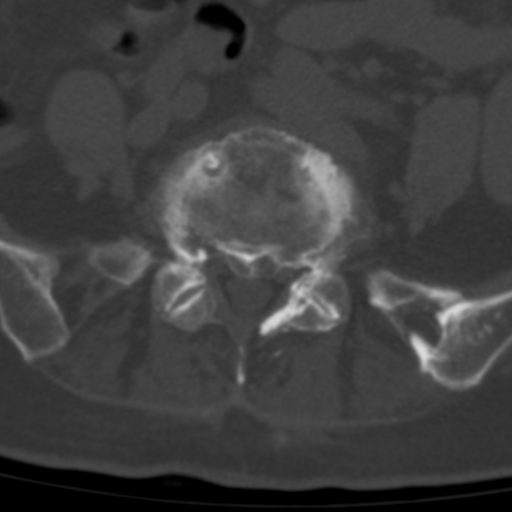
[im 70/139  bone]
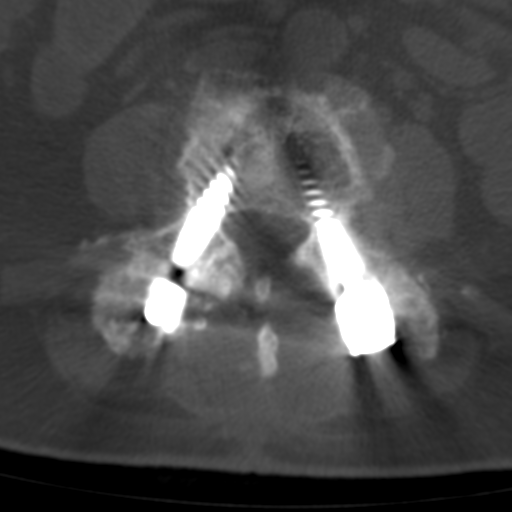
[im 93/139  bone]
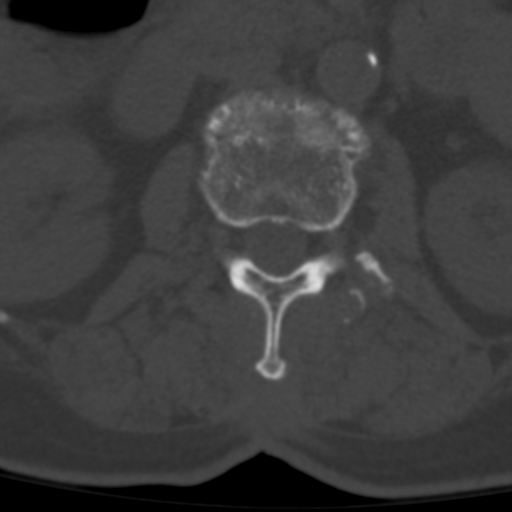
[im 116/139  soft-tissue]
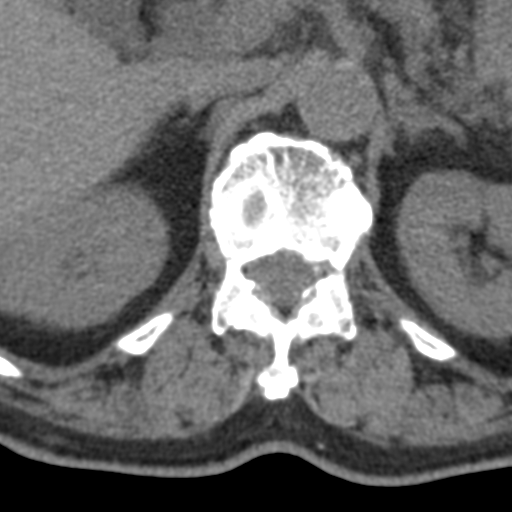
[im 116/139  bone]
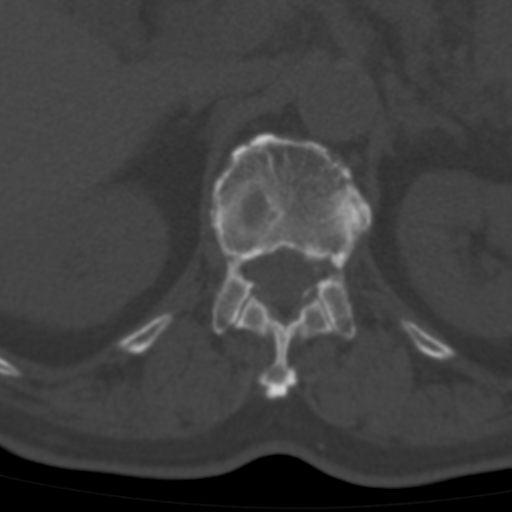

[Series 5: axial (person_name) · axial · 0.27mm/px · z∈[-304,-120]mm · 5 of 139 slices shown]
[im 24/139  bone]
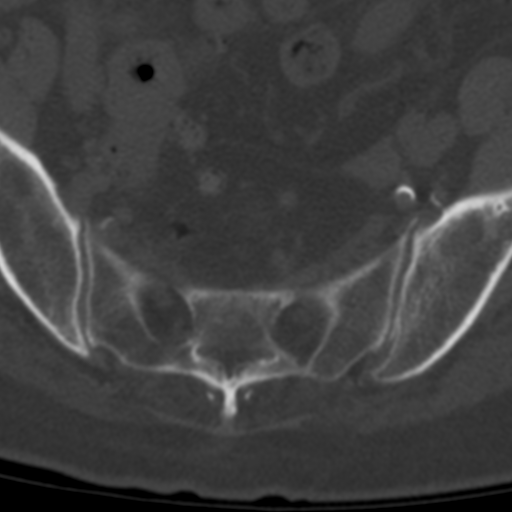
[im 47/139  bone]
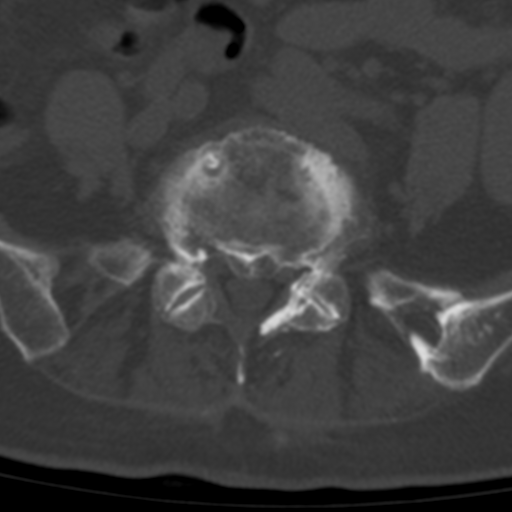
[im 70/139  bone]
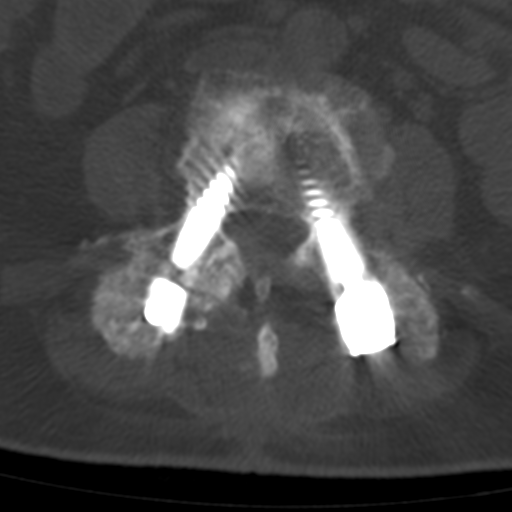
[im 93/139  bone]
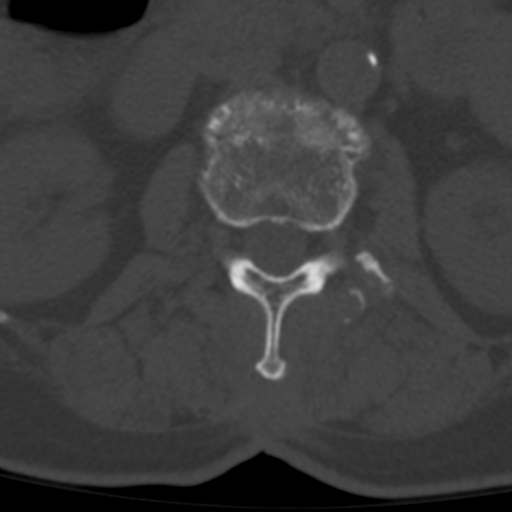
[im 116/139  bone]
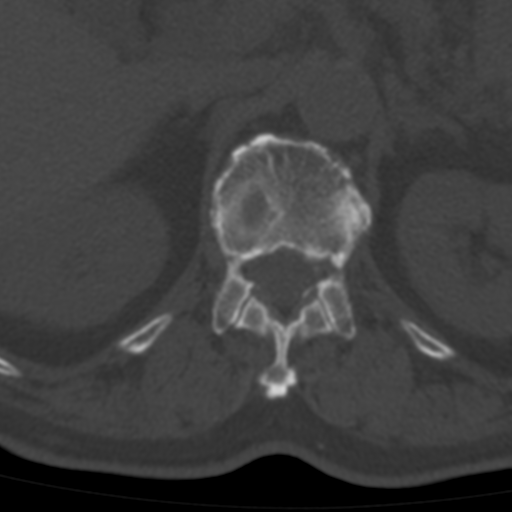

[Series 9: cor st · coronal · 0.27mm/px · 3 of 63 slices shown]
[im 13/63  bone]
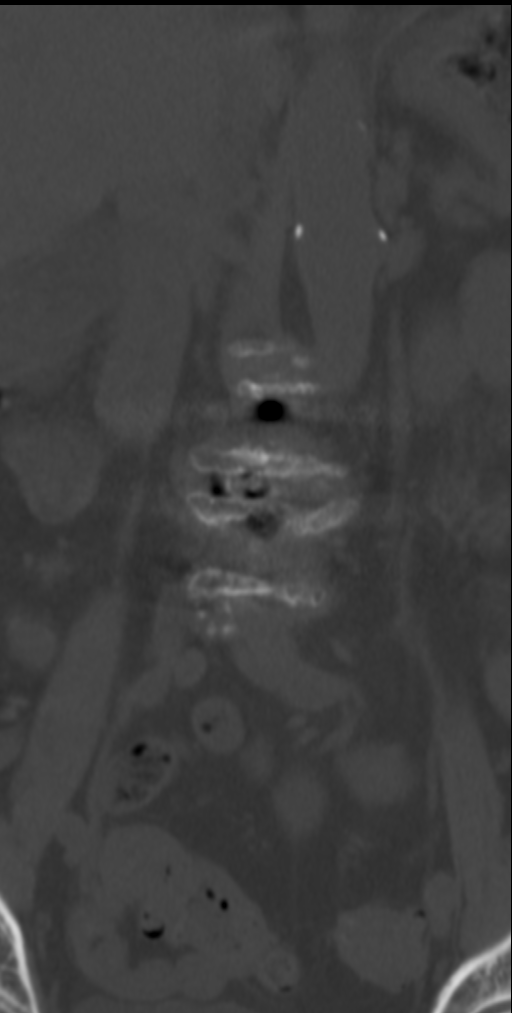
[im 25/63  bone]
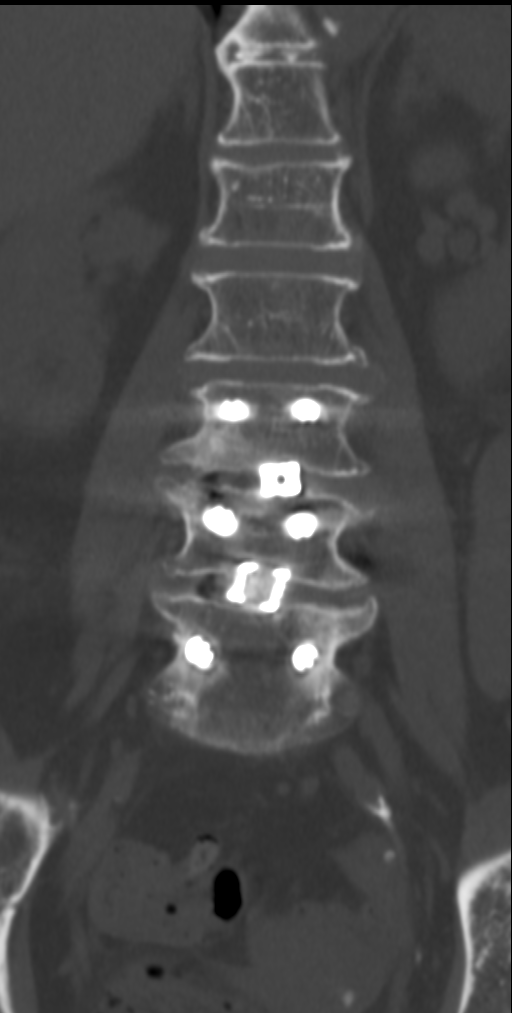
[im 38/63  bone]
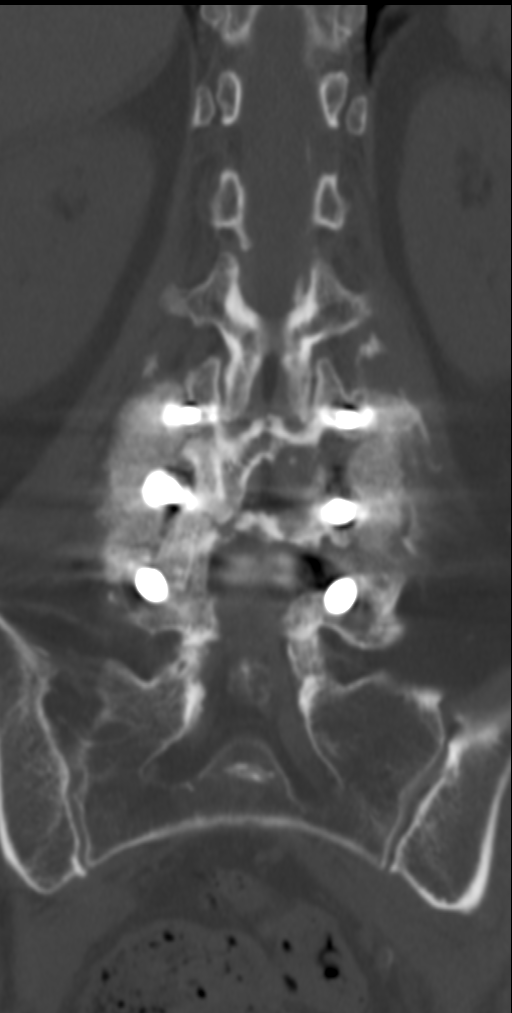

[13 of 33 positions shown; findings below may reference images not displayed]

Count of known CT and Cardiac Nuclear Medicine studies performed in the previous 
12 months = 1.
FINDINGS: -------------------------------------------------------------------------------- 
------ 
There is partial sacralization of L5 on the left.. Stable minimal retrolisthesis 
of L2 on L3. Stable mild anterolisthesis of L4 on L5. No acute fracture. 
Schmorls node along the superior endplate of L1. Osseous fusion L5-S1 is again 
seen. Interval postsurgical changes with transpedicular screw and rod fixation 
L3-L5 and posterior bone grafting. No foraminal encroachment of the 
transpedicular screws. There is lucency about the screws within L5 compatible 
with mechanical loosening. Interbody spacers L3-L4 and L4-L5. No subsidence. No 
hardware fracture. Scattered atheromatous calculations. Paraspinal musculature 
is symmetric. Mild degenerative changes SI joints. 
-------------------------------------------------------------------------------- 
------ 
SEGMENTAL: 
T12-L1: No disc herniation. No spinal canal or neural foraminal stenosis. 
L1-L2: No disc herniation. No spinal canal or neural foraminal stenosis. 
L2-L3: No disc herniation. No spinal canal or neural foraminal stenosis. 
L3-L4: Interval postsurgical changes. Left hemilaminectomy defects and 
facetectomy. No discrete spinal canal or neural foraminal stenosis. 
L4-L5: Interval postsurgical changes. Wide hemilaminectomy defects and 
facetectomies. No discrete spinal canal or neural foraminal stenosis. 
L5-S1: Osseous fusion. Mild dorsal osteophytic ridging. Mild facet hypertrophy. 
No spinal canal or neural foraminal stenosis
IMPRESSION: Interval postsurgical changes since the prior CT exam with posterior hardware 
fusion and interbody spacers L3-L5. There is lucency about the L5 screws 
suggestive for mechanical loosening. 
RADIATION DOSE REDUCTION: All CT scans are performed using radiation dose 
reduction techniques, when applicable.  Technical factors are evaluated and 
adjusted to ensure appropriate moderation of exposure.  Automated dose 
management technology is applied to adjust the radiation doses to minimize 
exposure while achieving diagnostic quality images.

## 2023-02-23 IMAGING — MR MRI LUMBAR SPINE WITHOUT CONTRAST
7 of 10 series · 15 of 48 positions shown · IV contrast (gadolinium)
Comparison: CT lumbar spine from January 03, 2023. CT lumbar spine from June 22, 2022. MRI lumbar spine from June 16, 2022.

________________________________________________________________________________________________ 
MRI LUMBAR SPINE WITHOUT CONTRAST, 02/23/2023 [DATE]: 
CLINICAL INDICATION: Low back pain with radiation to bilateral legs. History of 
surgery.
TECHNIQUE: Multiplanar, multiecho position MR images of the lumbar spine were 
performed without intravenous gadolinium enhancement. Patient was scanned on a 
1.5T magnet

[Series 101: survey · axial · 10.0mm · 1.25mm/px · 1 of 10 slices shown]
[im 1/10]
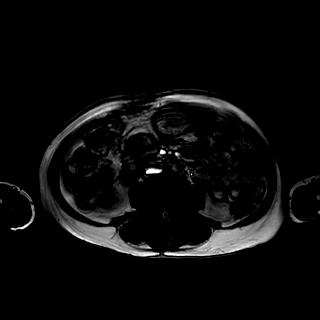

[Series 201: t2w_cor-surv · coronal · 6.0mm · 0.62mm/px · 1 of 10 slices shown]
[im 1/10]
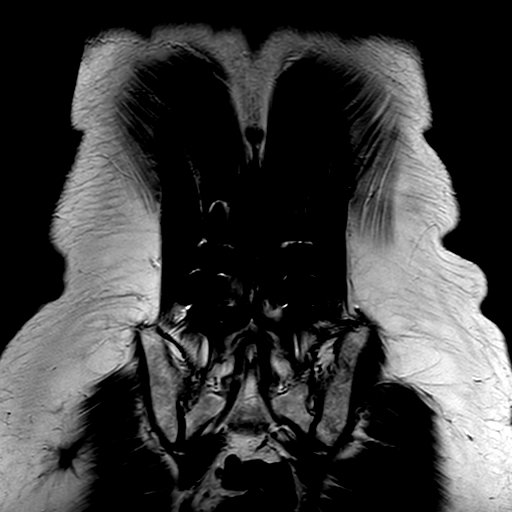

[Series 301: t1_tse_sag · sagittal · 4.0mm · 0.48mm/px · 2 of 18 slices shown]
[im 1/18]
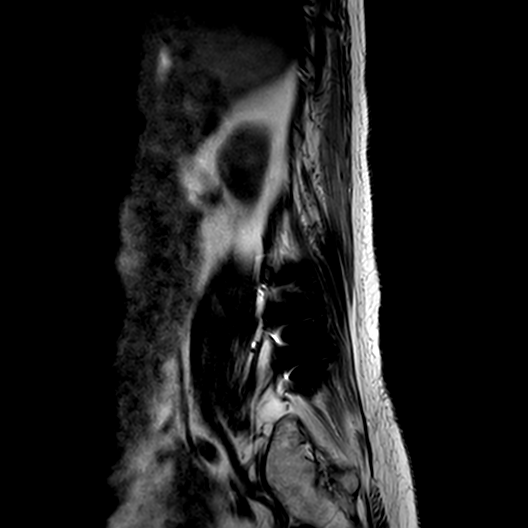
[im 18/18]
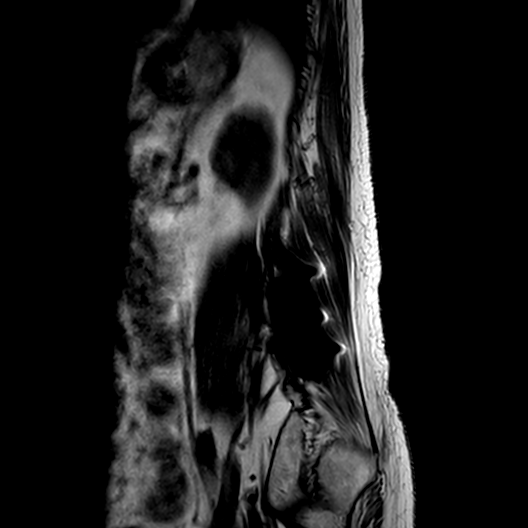

[Series 402: (id)_mdixon_tse · sagittal · 4.0mm · 0.40mm/px · 2 of 18 slices shown]
[im 1/18]
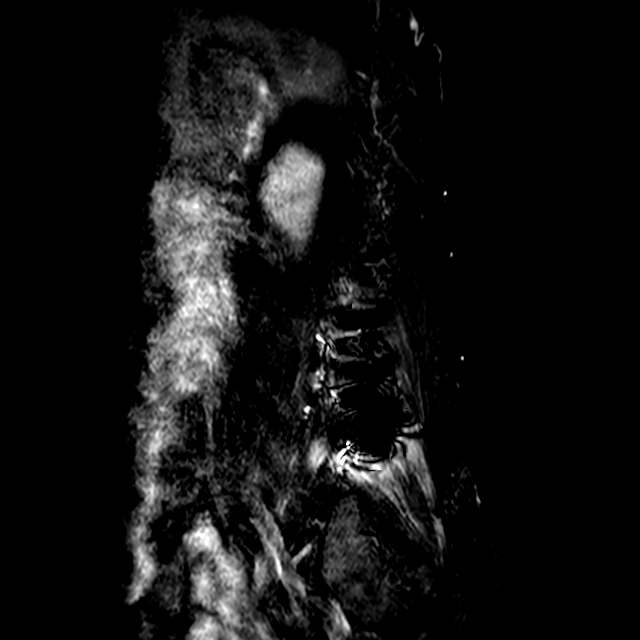
[im 18/18]
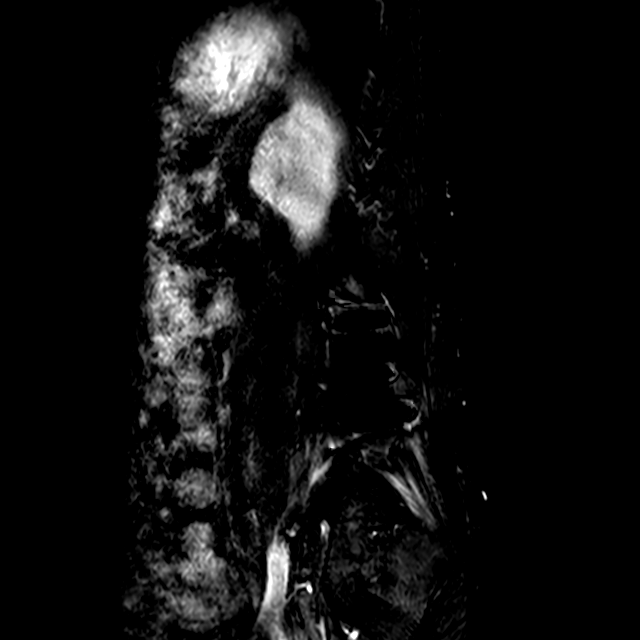

[Series 403: st2w_mdixon_tse · sagittal · 4.0mm · 0.40mm/px · 2 of 18 slices shown]
[im 1/18]
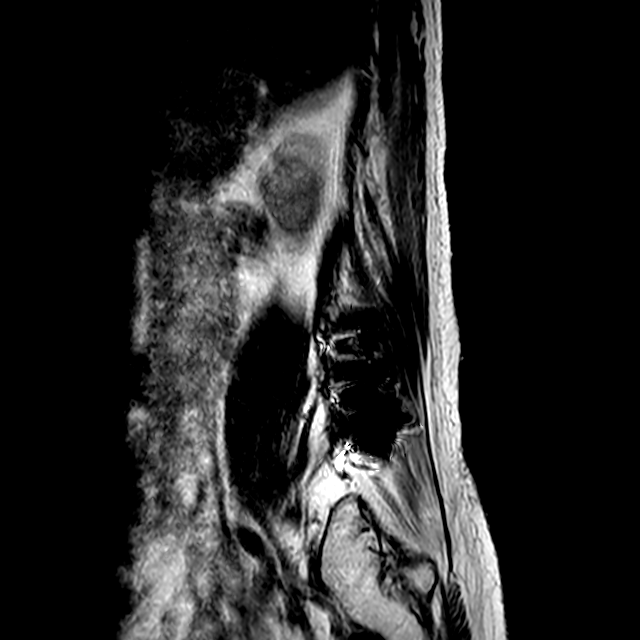
[im 18/18]
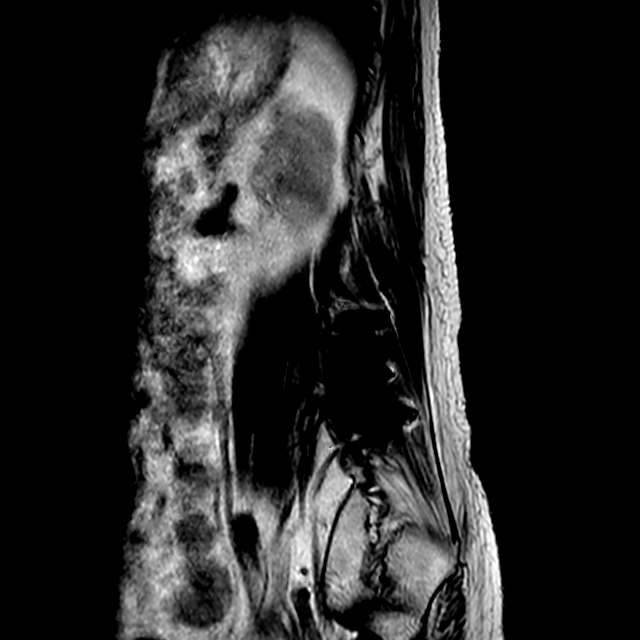

[Series 701: T1 · axial · 4.0mm · 0.33mm/px · z∈[-127,+57]mm · 2 of 22 slices shown (1 of 2)]
[im 1/22]
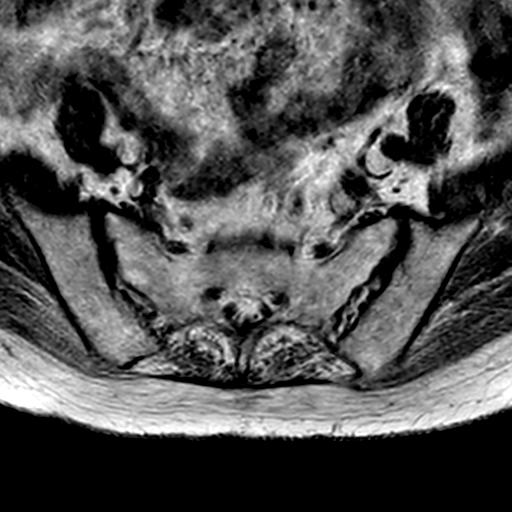
[im 22/22]
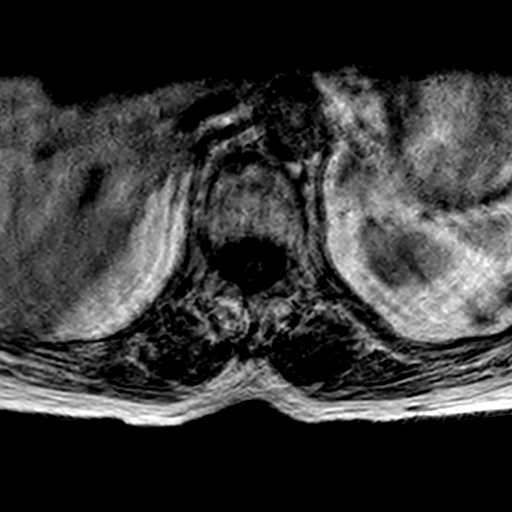

[Series 801: T1 · axial · 4.0mm · 0.33mm/px · z∈[-127,+57]mm · 5 of 42 slices shown (2 of 2)]
[im 1/42]
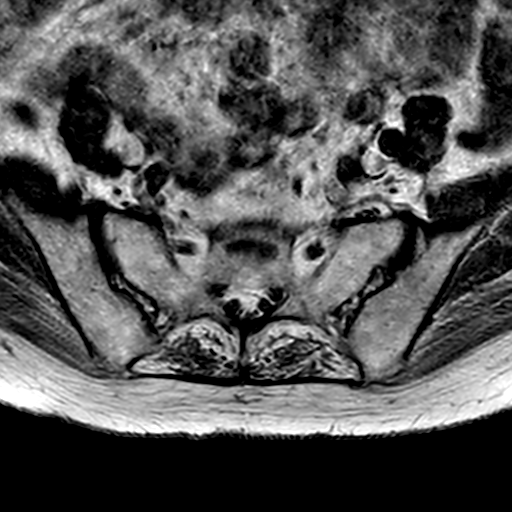
[im 11/42]
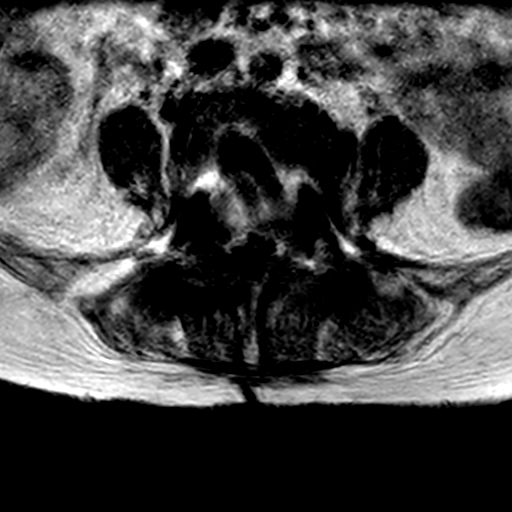
[im 21/42]
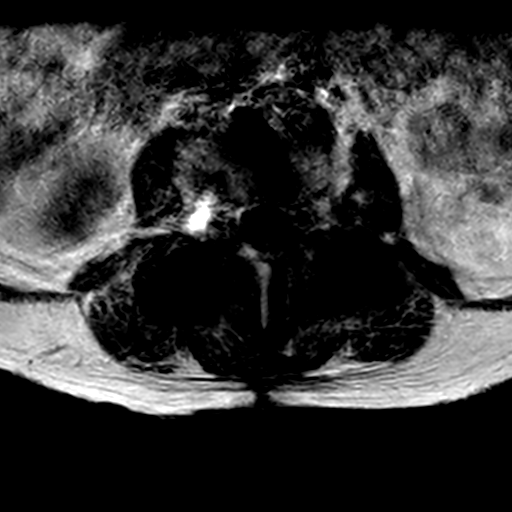
[im 31/42]
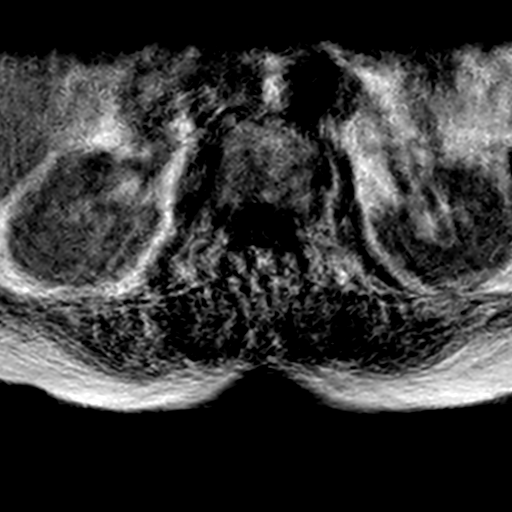
[im 42/42]
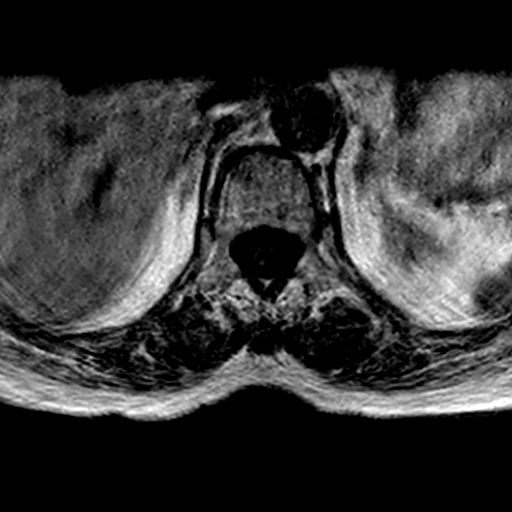

[15 of 48 positions shown; findings below may reference images not displayed]

FINDINGS: -------------------------------------------------------------------------------- 
------ 
GENERAL: 
POSTSURGICAL: Status post lumbar hardware fusion from L3 through L5 with pedicle 
screws and posterior fusion rods. Discectomy with intervertebral spacer 
placement at L3-L4 and L4-L5 levels     
ALIGNMENT: Mild anterolisthesis of L4 on L5. 
VERTEBRAL BODY HEIGHT: Normal.  
MARROW SIGNAL: No focal suspect signal abnormality. 
CORD SIGNAL: Normal distal spinal cord and cauda equina.  Conus terminates at 
T12-L1. 
ADDITIONAL FINDINGS: Small left renal cyst. Sigmoid diverticulosis. 
Modic I-II: Modic change at L2-L3, L5-S1. 
Ligamentum Flavum > 2.5 mm: All nonsurgical levels. 
-------------------------------------------------------------------------------- 
------ 
SEGMENTAL: 
T12-L1: Trace disc bulge; no significant central canal narrowing.  No 
significant right neural foraminal narrowing. No significant left neural 
foraminal narrowing.  
L1-L2: Disc bulge with shallow right subarticular disc herniation; no 
significant central canal narrowing, mild bilateral subarticular recess 
narrowing.  No significant right neural foraminal narrowing. No significant left 
neural foraminal narrowing.  
L2-L3: Disc bulge eccentric to the left, bilateral facet hypertrophy; mild 
central canal narrowing with associated bilateral subarticular recess narrowing. 
 No significant right neural foraminal narrowing. Mild left neural foraminal 
narrowing.  
L3-L4: Left hemilaminectomy and left facetectomy, discectomy with intervertebral 
spacer placement, posterior hardware fusion, right facet hypertrophy; no 
significant central canal narrowing, mild right subarticular recess narrowing.  
Moderate right neural foraminal narrowing. No significant left neural foraminal 
narrowing.  
L4-L5: Discectomy with intervertebral spacer placement, posterior hardware 
fusion, bilateral hemilaminectomy and left facetectomy, right facet hypertrophy; 
mild left-sided central canal narrowing, the intervertebral spacer does extend 
posteriorly beyond the margins of the disc space causing suspected severe left 
subarticular recess narrowing.  No significant right neural foraminal narrowing. 
No significant left neural foraminal narrowing.  
L5-S1: Osseous fusion across the disc space, residual disc osteophyte complex; 
no significant central canal narrowing.  No significant right neural foraminal 
narrowing. No significant left neural foraminal narrowing.  
-------------------------------------------------------------------------------- 
------
IMPRESSION: 1.  Postsurgical as well as discogenic/degenerative changes above. 
2.  Intervertebral spacer at the L4-L5 level extends posteriorly beyond the 
margins of the disc space causing suspected severe left subarticular recess 
narrowing at L4-L5. Please correlate for traversing left L5 radiculopathy.

## 2023-03-14 IMAGING — MG MAMMOGRAPHY SCREENING LEFT UNILATERAL 3D TOMO
4 series · 4 of 12 positions shown · non-contrast
Comparison: Comparison was made to prior examinations.

________________________________________________________________________________________________ 
MAMMOGRAPHY SCREENING LEFT UNILATERAL 3D TOMO, 03/14/2023 [DATE]: 
CLINICAL INDICATION: Encounter For Screening Mammogram For Malignant Neoplasm Of
TECHNIQUE: Unilateral left breast digital mammography and 3-D Tomosynthesis were 
obtained. In addition, computer-aided detection was utilized. 
BREAST DENSITY: (Level B) There are scattered areas of fibroglandular density.

[L MLO]
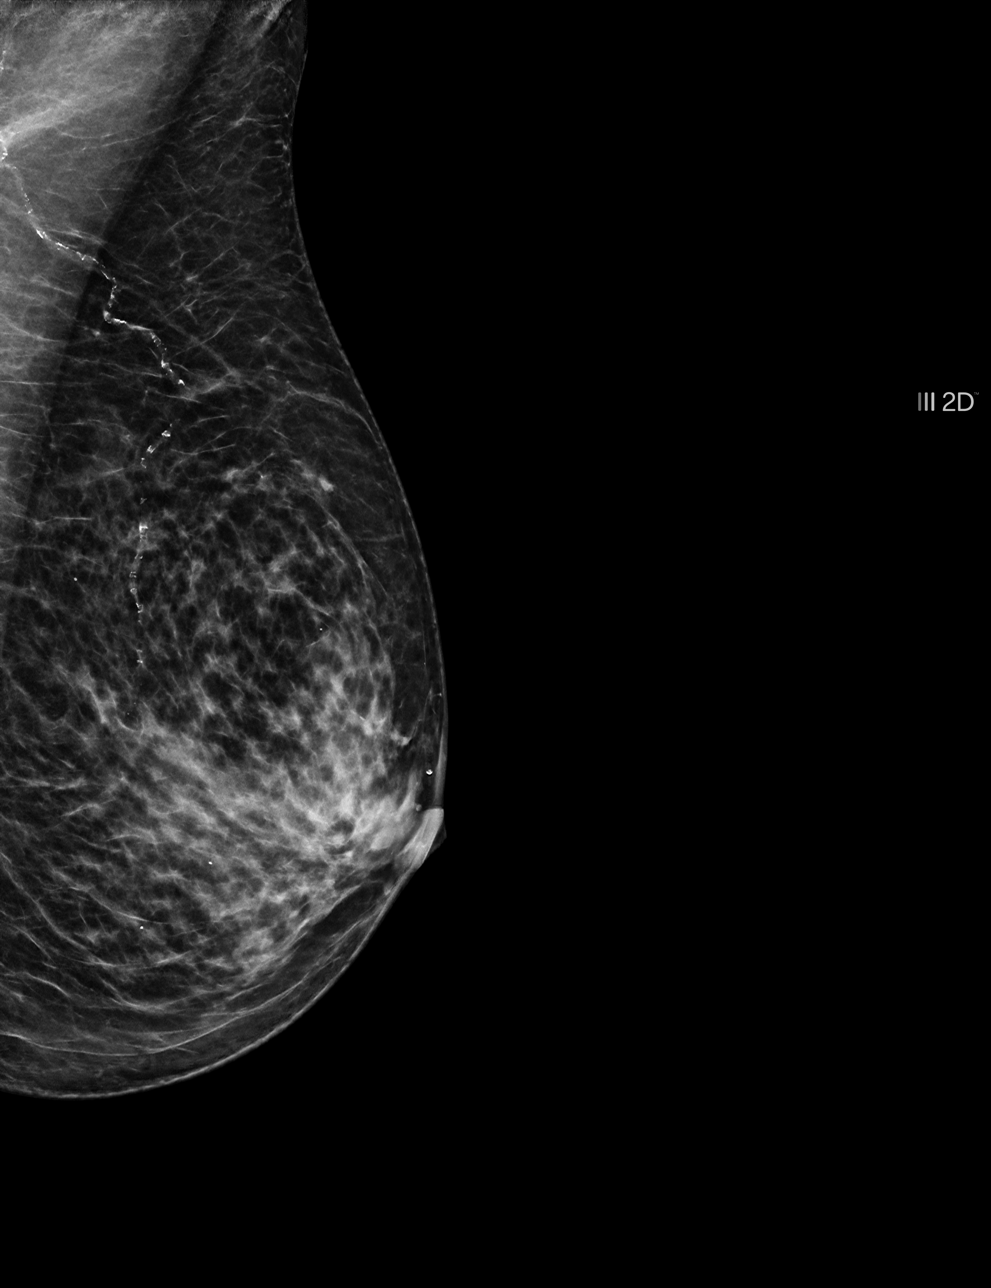

[L CC]
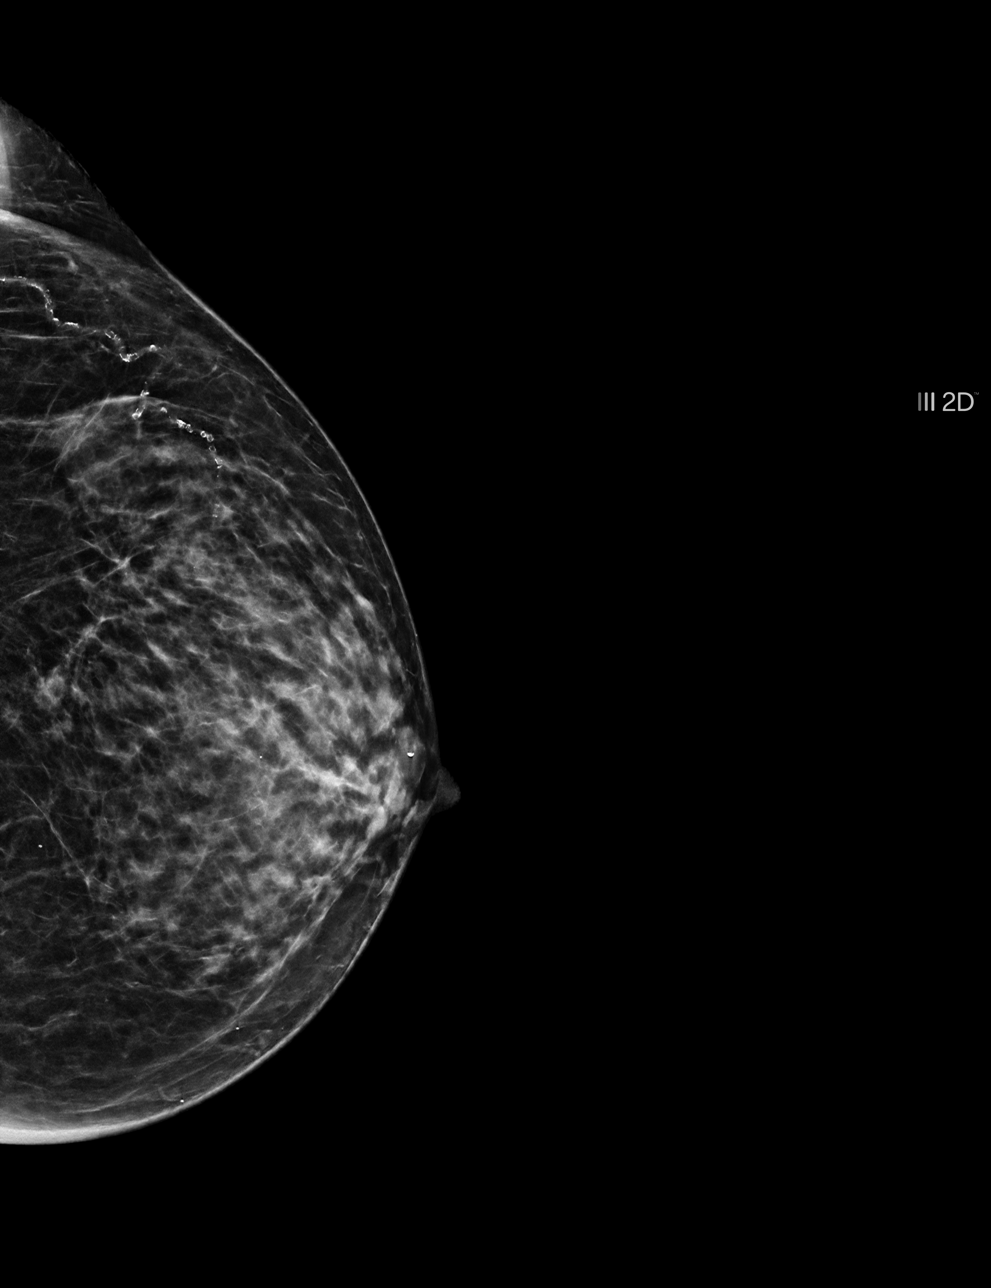

[L MLO tomo · tomo slice 8/15.0]
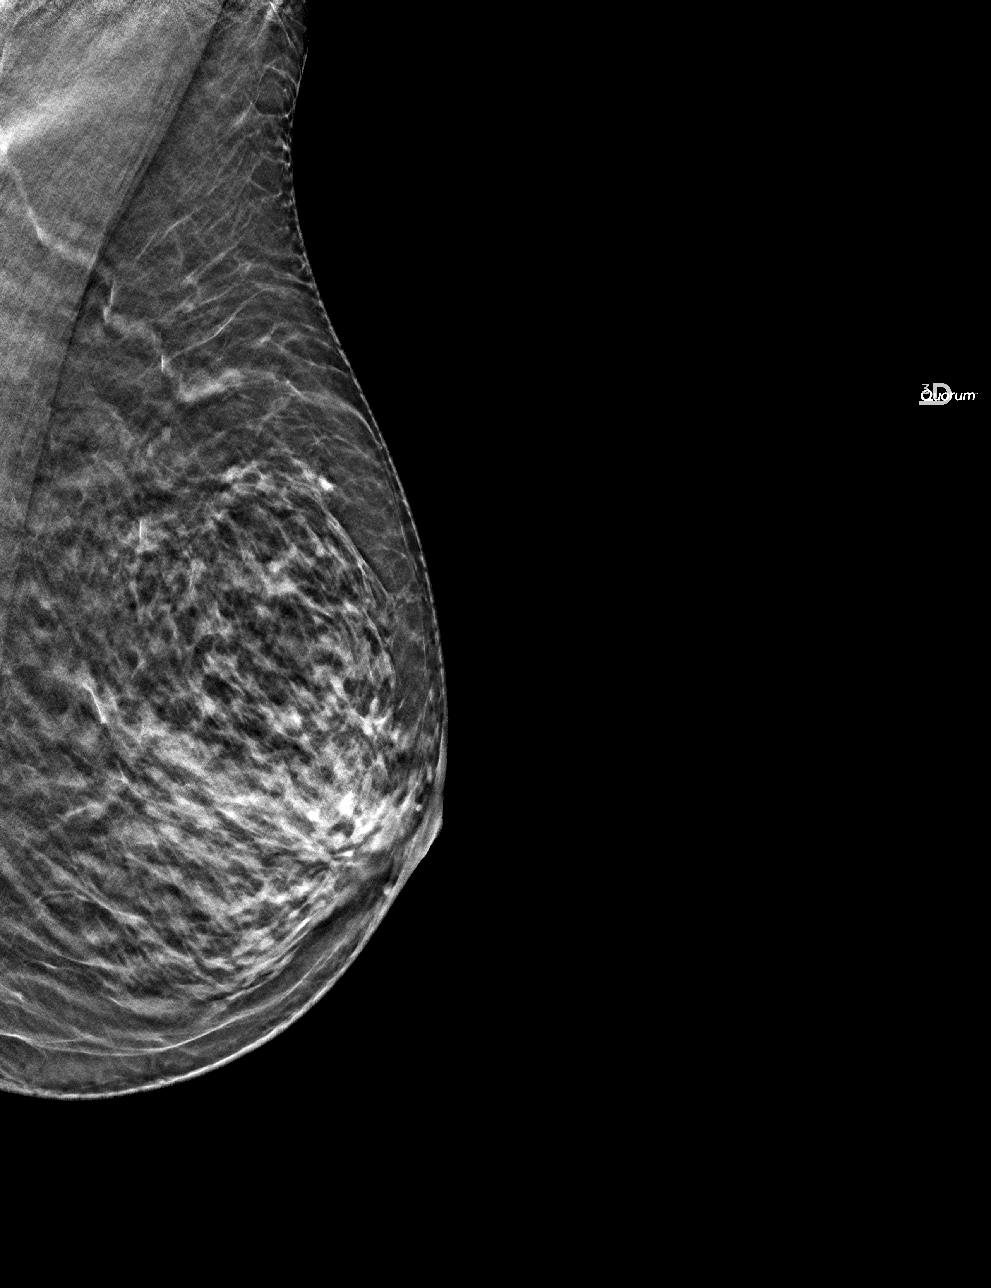

[L CC tomo · tomo slice 8/15.0]
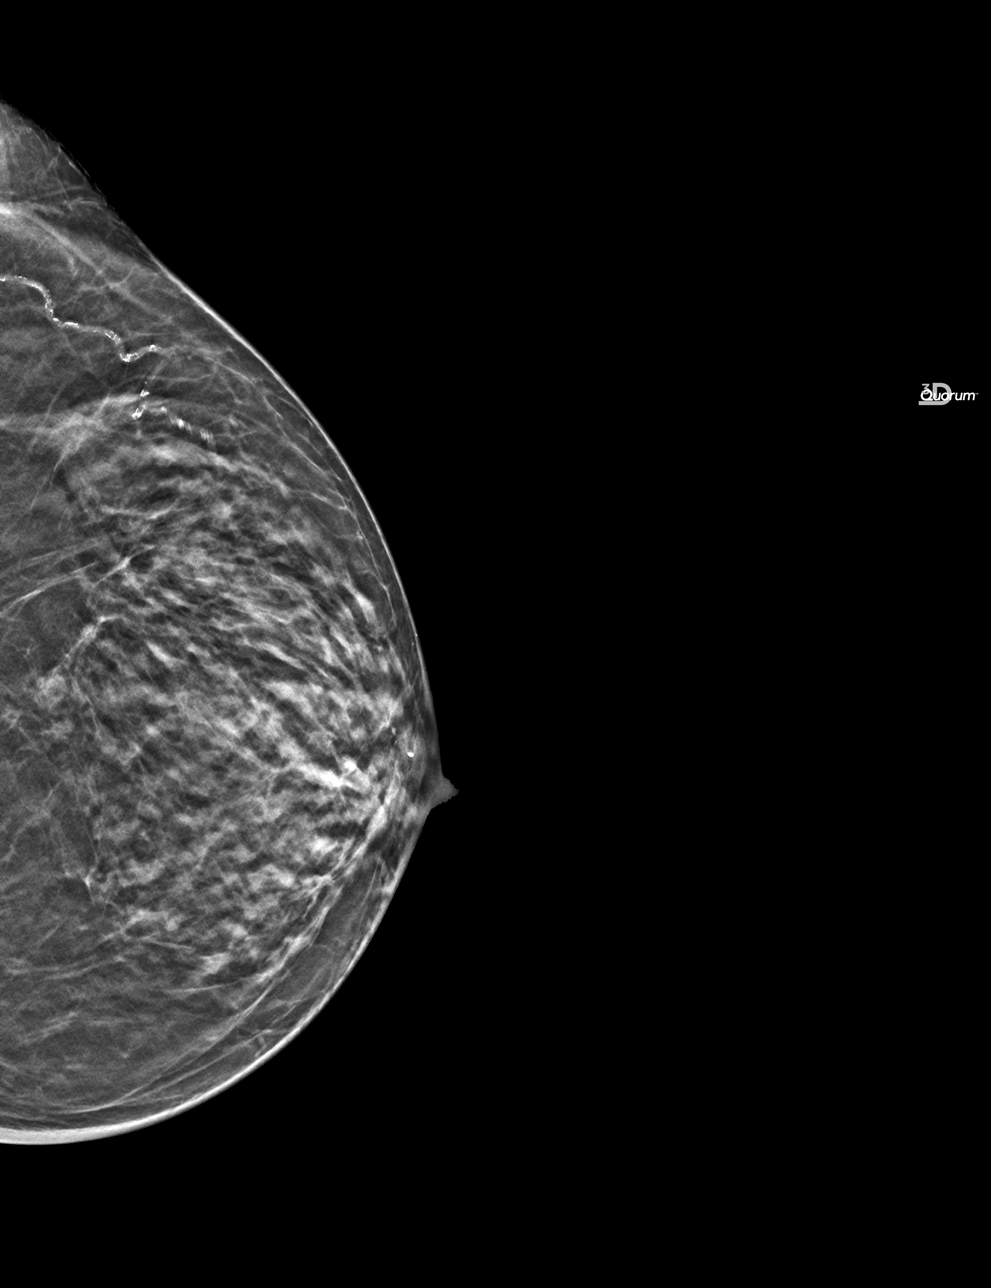

[4 of 12 positions shown; findings below may reference images not displayed]

FINDINGS: No suspicious mass, calcifications, or area of architectural 
distortion in the left breast.
IMPRESSION: (BI-RADS 2) Benign findings. Routine mammographic follow-up is recommended.
